# Patient Record
Sex: Female | Born: 1935 | Race: White | Hispanic: No | Smoking: Never smoker
Health system: Southern US, Community
[De-identification: ages and names within clinical notes are randomized; demographics above are authoritative.]

## PROBLEM LIST (undated history)

## (undated) DIAGNOSIS — M545 Low back pain, unspecified: Secondary | ICD-10-CM

## (undated) DIAGNOSIS — M810 Age-related osteoporosis without current pathological fracture: Secondary | ICD-10-CM

## (undated) DIAGNOSIS — I10 Essential (primary) hypertension: Secondary | ICD-10-CM

## (undated) DIAGNOSIS — E782 Mixed hyperlipidemia: Secondary | ICD-10-CM

## (undated) DIAGNOSIS — I872 Venous insufficiency (chronic) (peripheral): Secondary | ICD-10-CM

## (undated) HISTORY — DX: Age-related osteoporosis without current pathological fracture: M81.0

## (undated) HISTORY — DX: Low back pain, unspecified: M54.50

## (undated) HISTORY — DX: Hypercalcemia: E83.52

## (undated) HISTORY — DX: Essential (primary) hypertension: I10

## (undated) HISTORY — PX: COLONOSCOPY: SHX174

## (undated) HISTORY — DX: Mixed hyperlipidemia: E78.2

## (undated) HISTORY — PX: GALLBLADDER SURGERY: SHX652

---

## 2009-05-20 ENCOUNTER — Ambulatory Visit: Payer: Self-pay | Admitting: Cardiology

## 2011-07-31 DIAGNOSIS — I10 Essential (primary) hypertension: Secondary | ICD-10-CM | POA: Diagnosis not present

## 2011-07-31 DIAGNOSIS — M159 Polyosteoarthritis, unspecified: Secondary | ICD-10-CM | POA: Diagnosis not present

## 2011-07-31 DIAGNOSIS — E782 Mixed hyperlipidemia: Secondary | ICD-10-CM | POA: Diagnosis not present

## 2011-07-31 DIAGNOSIS — M549 Dorsalgia, unspecified: Secondary | ICD-10-CM | POA: Diagnosis not present

## 2011-07-31 DIAGNOSIS — K219 Gastro-esophageal reflux disease without esophagitis: Secondary | ICD-10-CM | POA: Diagnosis not present

## 2011-07-31 DIAGNOSIS — R5382 Chronic fatigue, unspecified: Secondary | ICD-10-CM | POA: Diagnosis not present

## 2011-08-10 DIAGNOSIS — M79609 Pain in unspecified limb: Secondary | ICD-10-CM | POA: Diagnosis not present

## 2011-08-10 DIAGNOSIS — M659 Synovitis and tenosynovitis, unspecified: Secondary | ICD-10-CM | POA: Diagnosis not present

## 2011-08-10 DIAGNOSIS — M779 Enthesopathy, unspecified: Secondary | ICD-10-CM | POA: Diagnosis not present

## 2011-08-27 DIAGNOSIS — R109 Unspecified abdominal pain: Secondary | ICD-10-CM | POA: Diagnosis not present

## 2011-08-31 DIAGNOSIS — M659 Synovitis and tenosynovitis, unspecified: Secondary | ICD-10-CM | POA: Diagnosis not present

## 2011-08-31 DIAGNOSIS — M79609 Pain in unspecified limb: Secondary | ICD-10-CM | POA: Diagnosis not present

## 2011-09-02 DIAGNOSIS — K5730 Diverticulosis of large intestine without perforation or abscess without bleeding: Secondary | ICD-10-CM | POA: Diagnosis not present

## 2011-09-02 DIAGNOSIS — K5289 Other specified noninfective gastroenteritis and colitis: Secondary | ICD-10-CM | POA: Diagnosis not present

## 2011-09-02 DIAGNOSIS — R109 Unspecified abdominal pain: Secondary | ICD-10-CM | POA: Diagnosis not present

## 2011-09-02 DIAGNOSIS — M1990 Unspecified osteoarthritis, unspecified site: Secondary | ICD-10-CM | POA: Diagnosis not present

## 2011-09-02 DIAGNOSIS — K219 Gastro-esophageal reflux disease without esophagitis: Secondary | ICD-10-CM | POA: Diagnosis not present

## 2011-09-02 DIAGNOSIS — I10 Essential (primary) hypertension: Secondary | ICD-10-CM | POA: Diagnosis not present

## 2011-09-02 DIAGNOSIS — K319 Disease of stomach and duodenum, unspecified: Secondary | ICD-10-CM | POA: Diagnosis not present

## 2011-09-02 DIAGNOSIS — G252 Other specified forms of tremor: Secondary | ICD-10-CM | POA: Diagnosis not present

## 2011-09-02 DIAGNOSIS — R1013 Epigastric pain: Secondary | ICD-10-CM | POA: Diagnosis not present

## 2011-09-02 DIAGNOSIS — K644 Residual hemorrhoidal skin tags: Secondary | ICD-10-CM | POA: Diagnosis not present

## 2011-09-02 DIAGNOSIS — Z79899 Other long term (current) drug therapy: Secondary | ICD-10-CM | POA: Diagnosis not present

## 2011-09-02 DIAGNOSIS — D126 Benign neoplasm of colon, unspecified: Secondary | ICD-10-CM | POA: Diagnosis not present

## 2011-09-17 DIAGNOSIS — K319 Disease of stomach and duodenum, unspecified: Secondary | ICD-10-CM | POA: Diagnosis not present

## 2011-10-09 DIAGNOSIS — Z79899 Other long term (current) drug therapy: Secondary | ICD-10-CM | POA: Diagnosis not present

## 2011-10-09 DIAGNOSIS — K259 Gastric ulcer, unspecified as acute or chronic, without hemorrhage or perforation: Secondary | ICD-10-CM | POA: Diagnosis not present

## 2011-10-09 DIAGNOSIS — F411 Generalized anxiety disorder: Secondary | ICD-10-CM | POA: Diagnosis not present

## 2011-10-09 DIAGNOSIS — N200 Calculus of kidney: Secondary | ICD-10-CM | POA: Diagnosis not present

## 2011-10-09 DIAGNOSIS — K319 Disease of stomach and duodenum, unspecified: Secondary | ICD-10-CM | POA: Diagnosis not present

## 2011-10-09 DIAGNOSIS — I10 Essential (primary) hypertension: Secondary | ICD-10-CM | POA: Diagnosis not present

## 2011-10-09 DIAGNOSIS — D371 Neoplasm of uncertain behavior of stomach: Secondary | ICD-10-CM | POA: Diagnosis not present

## 2011-10-09 DIAGNOSIS — D379 Neoplasm of uncertain behavior of digestive organ, unspecified: Secondary | ICD-10-CM | POA: Diagnosis not present

## 2011-11-06 DIAGNOSIS — M76899 Other specified enthesopathies of unspecified lower limb, excluding foot: Secondary | ICD-10-CM | POA: Diagnosis not present

## 2011-11-06 DIAGNOSIS — M25569 Pain in unspecified knee: Secondary | ICD-10-CM | POA: Diagnosis not present

## 2012-02-01 DIAGNOSIS — H43399 Other vitreous opacities, unspecified eye: Secondary | ICD-10-CM | POA: Diagnosis not present

## 2012-02-01 DIAGNOSIS — H43819 Vitreous degeneration, unspecified eye: Secondary | ICD-10-CM | POA: Diagnosis not present

## 2012-02-11 DIAGNOSIS — E782 Mixed hyperlipidemia: Secondary | ICD-10-CM | POA: Diagnosis not present

## 2012-02-11 DIAGNOSIS — M1710 Unilateral primary osteoarthritis, unspecified knee: Secondary | ICD-10-CM | POA: Diagnosis not present

## 2012-03-29 DIAGNOSIS — Z23 Encounter for immunization: Secondary | ICD-10-CM | POA: Diagnosis not present

## 2012-05-06 DIAGNOSIS — M549 Dorsalgia, unspecified: Secondary | ICD-10-CM | POA: Diagnosis not present

## 2012-05-23 DIAGNOSIS — M1710 Unilateral primary osteoarthritis, unspecified knee: Secondary | ICD-10-CM | POA: Diagnosis not present

## 2012-05-30 DIAGNOSIS — Z1231 Encounter for screening mammogram for malignant neoplasm of breast: Secondary | ICD-10-CM | POA: Diagnosis not present

## 2012-06-06 DIAGNOSIS — M1710 Unilateral primary osteoarthritis, unspecified knee: Secondary | ICD-10-CM | POA: Diagnosis not present

## 2012-06-13 DIAGNOSIS — M1710 Unilateral primary osteoarthritis, unspecified knee: Secondary | ICD-10-CM | POA: Diagnosis not present

## 2012-06-20 DIAGNOSIS — M1710 Unilateral primary osteoarthritis, unspecified knee: Secondary | ICD-10-CM | POA: Diagnosis not present

## 2012-08-05 DIAGNOSIS — K2990 Gastroduodenitis, unspecified, without bleeding: Secondary | ICD-10-CM | POA: Diagnosis not present

## 2012-08-05 DIAGNOSIS — I10 Essential (primary) hypertension: Secondary | ICD-10-CM | POA: Diagnosis not present

## 2012-08-25 DIAGNOSIS — R109 Unspecified abdominal pain: Secondary | ICD-10-CM | POA: Diagnosis not present

## 2012-09-08 DIAGNOSIS — M549 Dorsalgia, unspecified: Secondary | ICD-10-CM | POA: Diagnosis not present

## 2012-09-12 DIAGNOSIS — R109 Unspecified abdominal pain: Secondary | ICD-10-CM | POA: Diagnosis not present

## 2012-09-15 DIAGNOSIS — M4120 Other idiopathic scoliosis, site unspecified: Secondary | ICD-10-CM | POA: Diagnosis not present

## 2012-09-15 DIAGNOSIS — M48061 Spinal stenosis, lumbar region without neurogenic claudication: Secondary | ICD-10-CM | POA: Diagnosis not present

## 2012-09-15 DIAGNOSIS — M5137 Other intervertebral disc degeneration, lumbosacral region: Secondary | ICD-10-CM | POA: Diagnosis not present

## 2012-09-15 DIAGNOSIS — M47817 Spondylosis without myelopathy or radiculopathy, lumbosacral region: Secondary | ICD-10-CM | POA: Diagnosis not present

## 2012-09-15 DIAGNOSIS — M5126 Other intervertebral disc displacement, lumbar region: Secondary | ICD-10-CM | POA: Diagnosis not present

## 2012-10-31 DIAGNOSIS — IMO0002 Reserved for concepts with insufficient information to code with codable children: Secondary | ICD-10-CM | POA: Diagnosis not present

## 2012-10-31 DIAGNOSIS — M1710 Unilateral primary osteoarthritis, unspecified knee: Secondary | ICD-10-CM | POA: Diagnosis not present

## 2012-11-07 DIAGNOSIS — E782 Mixed hyperlipidemia: Secondary | ICD-10-CM | POA: Diagnosis not present

## 2012-11-07 DIAGNOSIS — I10 Essential (primary) hypertension: Secondary | ICD-10-CM | POA: Diagnosis not present

## 2012-11-07 DIAGNOSIS — M76899 Other specified enthesopathies of unspecified lower limb, excluding foot: Secondary | ICD-10-CM | POA: Diagnosis not present

## 2012-11-07 DIAGNOSIS — Z0000 Encounter for general adult medical examination without abnormal findings: Secondary | ICD-10-CM | POA: Diagnosis not present

## 2012-11-25 DIAGNOSIS — J209 Acute bronchitis, unspecified: Secondary | ICD-10-CM | POA: Diagnosis not present

## 2012-11-25 DIAGNOSIS — Z79899 Other long term (current) drug therapy: Secondary | ICD-10-CM | POA: Diagnosis not present

## 2012-11-25 DIAGNOSIS — J449 Chronic obstructive pulmonary disease, unspecified: Secondary | ICD-10-CM | POA: Diagnosis not present

## 2012-11-25 DIAGNOSIS — I10 Essential (primary) hypertension: Secondary | ICD-10-CM | POA: Diagnosis not present

## 2012-12-01 DIAGNOSIS — J209 Acute bronchitis, unspecified: Secondary | ICD-10-CM | POA: Diagnosis not present

## 2012-12-14 DIAGNOSIS — M1710 Unilateral primary osteoarthritis, unspecified knee: Secondary | ICD-10-CM | POA: Diagnosis not present

## 2012-12-14 DIAGNOSIS — M2340 Loose body in knee, unspecified knee: Secondary | ICD-10-CM | POA: Diagnosis not present

## 2012-12-14 DIAGNOSIS — M23329 Other meniscus derangements, posterior horn of medial meniscus, unspecified knee: Secondary | ICD-10-CM | POA: Diagnosis not present

## 2012-12-14 DIAGNOSIS — M899 Disorder of bone, unspecified: Secondary | ICD-10-CM | POA: Diagnosis not present

## 2012-12-14 DIAGNOSIS — IMO0002 Reserved for concepts with insufficient information to code with codable children: Secondary | ICD-10-CM | POA: Diagnosis not present

## 2012-12-19 DIAGNOSIS — M1710 Unilateral primary osteoarthritis, unspecified knee: Secondary | ICD-10-CM | POA: Diagnosis not present

## 2012-12-19 DIAGNOSIS — IMO0002 Reserved for concepts with insufficient information to code with codable children: Secondary | ICD-10-CM | POA: Diagnosis not present

## 2012-12-20 DIAGNOSIS — J0100 Acute maxillary sinusitis, unspecified: Secondary | ICD-10-CM | POA: Diagnosis not present

## 2013-01-10 DIAGNOSIS — Z888 Allergy status to other drugs, medicaments and biological substances status: Secondary | ICD-10-CM | POA: Diagnosis not present

## 2013-01-10 DIAGNOSIS — M1990 Unspecified osteoarthritis, unspecified site: Secondary | ICD-10-CM | POA: Diagnosis not present

## 2013-01-10 DIAGNOSIS — Z8489 Family history of other specified conditions: Secondary | ICD-10-CM | POA: Diagnosis not present

## 2013-01-10 DIAGNOSIS — Z880 Allergy status to penicillin: Secondary | ICD-10-CM | POA: Diagnosis not present

## 2013-01-10 DIAGNOSIS — M1710 Unilateral primary osteoarthritis, unspecified knee: Secondary | ICD-10-CM | POA: Diagnosis not present

## 2013-01-10 DIAGNOSIS — M129 Arthropathy, unspecified: Secondary | ICD-10-CM | POA: Diagnosis not present

## 2013-01-10 DIAGNOSIS — I10 Essential (primary) hypertension: Secondary | ICD-10-CM | POA: Diagnosis not present

## 2013-01-10 DIAGNOSIS — F411 Generalized anxiety disorder: Secondary | ICD-10-CM | POA: Diagnosis not present

## 2013-01-10 DIAGNOSIS — Z883 Allergy status to other anti-infective agents status: Secondary | ICD-10-CM | POA: Diagnosis not present

## 2013-01-10 DIAGNOSIS — M2340 Loose body in knee, unspecified knee: Secondary | ICD-10-CM | POA: Diagnosis not present

## 2013-01-10 DIAGNOSIS — Z885 Allergy status to narcotic agent status: Secondary | ICD-10-CM | POA: Diagnosis not present

## 2013-01-10 DIAGNOSIS — Z91041 Radiographic dye allergy status: Secondary | ICD-10-CM | POA: Diagnosis not present

## 2013-01-10 DIAGNOSIS — K219 Gastro-esophageal reflux disease without esophagitis: Secondary | ICD-10-CM | POA: Diagnosis not present

## 2013-01-10 DIAGNOSIS — E7800 Pure hypercholesterolemia, unspecified: Secondary | ICD-10-CM | POA: Diagnosis not present

## 2013-01-10 DIAGNOSIS — IMO0002 Reserved for concepts with insufficient information to code with codable children: Secondary | ICD-10-CM | POA: Diagnosis not present

## 2013-01-10 DIAGNOSIS — Z79899 Other long term (current) drug therapy: Secondary | ICD-10-CM | POA: Diagnosis not present

## 2013-02-06 DIAGNOSIS — M549 Dorsalgia, unspecified: Secondary | ICD-10-CM | POA: Diagnosis not present

## 2013-02-14 DIAGNOSIS — M5430 Sciatica, unspecified side: Secondary | ICD-10-CM | POA: Diagnosis not present

## 2013-02-14 DIAGNOSIS — M549 Dorsalgia, unspecified: Secondary | ICD-10-CM | POA: Diagnosis not present

## 2013-02-14 DIAGNOSIS — M545 Low back pain: Secondary | ICD-10-CM | POA: Diagnosis not present

## 2013-02-16 DIAGNOSIS — M549 Dorsalgia, unspecified: Secondary | ICD-10-CM | POA: Diagnosis not present

## 2013-02-16 DIAGNOSIS — M5430 Sciatica, unspecified side: Secondary | ICD-10-CM | POA: Diagnosis not present

## 2013-02-16 DIAGNOSIS — M545 Low back pain: Secondary | ICD-10-CM | POA: Diagnosis not present

## 2013-02-21 DIAGNOSIS — M545 Low back pain: Secondary | ICD-10-CM | POA: Diagnosis not present

## 2013-02-21 DIAGNOSIS — M549 Dorsalgia, unspecified: Secondary | ICD-10-CM | POA: Diagnosis not present

## 2013-02-21 DIAGNOSIS — M5430 Sciatica, unspecified side: Secondary | ICD-10-CM | POA: Diagnosis not present

## 2013-02-23 DIAGNOSIS — M549 Dorsalgia, unspecified: Secondary | ICD-10-CM | POA: Diagnosis not present

## 2013-02-23 DIAGNOSIS — M545 Low back pain: Secondary | ICD-10-CM | POA: Diagnosis not present

## 2013-02-23 DIAGNOSIS — M5430 Sciatica, unspecified side: Secondary | ICD-10-CM | POA: Diagnosis not present

## 2013-02-28 DIAGNOSIS — M5430 Sciatica, unspecified side: Secondary | ICD-10-CM | POA: Diagnosis not present

## 2013-02-28 DIAGNOSIS — M545 Low back pain: Secondary | ICD-10-CM | POA: Diagnosis not present

## 2013-02-28 DIAGNOSIS — M549 Dorsalgia, unspecified: Secondary | ICD-10-CM | POA: Diagnosis not present

## 2013-03-02 DIAGNOSIS — M545 Low back pain: Secondary | ICD-10-CM | POA: Diagnosis not present

## 2013-03-02 DIAGNOSIS — M5430 Sciatica, unspecified side: Secondary | ICD-10-CM | POA: Diagnosis not present

## 2013-03-02 DIAGNOSIS — M549 Dorsalgia, unspecified: Secondary | ICD-10-CM | POA: Diagnosis not present

## 2013-03-09 DIAGNOSIS — M549 Dorsalgia, unspecified: Secondary | ICD-10-CM | POA: Diagnosis not present

## 2013-03-09 DIAGNOSIS — M5430 Sciatica, unspecified side: Secondary | ICD-10-CM | POA: Diagnosis not present

## 2013-03-09 DIAGNOSIS — M545 Low back pain: Secondary | ICD-10-CM | POA: Diagnosis not present

## 2013-03-16 DIAGNOSIS — M545 Low back pain: Secondary | ICD-10-CM | POA: Diagnosis not present

## 2013-03-16 DIAGNOSIS — M5430 Sciatica, unspecified side: Secondary | ICD-10-CM | POA: Diagnosis not present

## 2013-03-16 DIAGNOSIS — M549 Dorsalgia, unspecified: Secondary | ICD-10-CM | POA: Diagnosis not present

## 2013-03-27 DIAGNOSIS — I872 Venous insufficiency (chronic) (peripheral): Secondary | ICD-10-CM | POA: Diagnosis not present

## 2013-03-29 DIAGNOSIS — M7120 Synovial cyst of popliteal space [Baker], unspecified knee: Secondary | ICD-10-CM | POA: Diagnosis not present

## 2013-03-29 DIAGNOSIS — M7989 Other specified soft tissue disorders: Secondary | ICD-10-CM | POA: Diagnosis not present

## 2013-03-29 DIAGNOSIS — M79609 Pain in unspecified limb: Secondary | ICD-10-CM | POA: Diagnosis not present

## 2013-04-11 DIAGNOSIS — Z23 Encounter for immunization: Secondary | ICD-10-CM | POA: Diagnosis not present

## 2013-05-09 DIAGNOSIS — I10 Essential (primary) hypertension: Secondary | ICD-10-CM | POA: Diagnosis not present

## 2013-05-09 DIAGNOSIS — M549 Dorsalgia, unspecified: Secondary | ICD-10-CM | POA: Diagnosis not present

## 2013-05-22 DIAGNOSIS — M5137 Other intervertebral disc degeneration, lumbosacral region: Secondary | ICD-10-CM | POA: Diagnosis not present

## 2013-05-22 DIAGNOSIS — M545 Low back pain: Secondary | ICD-10-CM | POA: Diagnosis not present

## 2013-05-22 DIAGNOSIS — E663 Overweight: Secondary | ICD-10-CM | POA: Diagnosis not present

## 2013-06-01 DIAGNOSIS — Z1231 Encounter for screening mammogram for malignant neoplasm of breast: Secondary | ICD-10-CM | POA: Diagnosis not present

## 2013-08-15 DIAGNOSIS — G5600 Carpal tunnel syndrome, unspecified upper limb: Secondary | ICD-10-CM | POA: Diagnosis not present

## 2013-09-18 DIAGNOSIS — H353190 Nonexudative age-related macular degeneration, unspecified eye, stage unspecified: Secondary | ICD-10-CM | POA: Diagnosis not present

## 2013-09-18 DIAGNOSIS — H16229 Keratoconjunctivitis sicca, not specified as Sjogren's, unspecified eye: Secondary | ICD-10-CM | POA: Diagnosis not present

## 2013-10-11 DIAGNOSIS — F410 Panic disorder [episodic paroxysmal anxiety] without agoraphobia: Secondary | ICD-10-CM | POA: Diagnosis not present

## 2013-10-11 DIAGNOSIS — Z9889 Other specified postprocedural states: Secondary | ICD-10-CM | POA: Diagnosis not present

## 2013-10-11 DIAGNOSIS — Z79899 Other long term (current) drug therapy: Secondary | ICD-10-CM | POA: Diagnosis not present

## 2013-10-11 DIAGNOSIS — Z791 Long term (current) use of non-steroidal anti-inflammatories (NSAID): Secondary | ICD-10-CM | POA: Diagnosis not present

## 2013-10-11 DIAGNOSIS — K449 Diaphragmatic hernia without obstruction or gangrene: Secondary | ICD-10-CM | POA: Diagnosis not present

## 2013-10-11 DIAGNOSIS — Z91041 Radiographic dye allergy status: Secondary | ICD-10-CM | POA: Diagnosis not present

## 2013-10-11 DIAGNOSIS — D214 Benign neoplasm of connective and other soft tissue of abdomen: Secondary | ICD-10-CM | POA: Diagnosis not present

## 2013-10-11 DIAGNOSIS — M1990 Unspecified osteoarthritis, unspecified site: Secondary | ICD-10-CM | POA: Diagnosis not present

## 2013-10-11 DIAGNOSIS — Z880 Allergy status to penicillin: Secondary | ICD-10-CM | POA: Diagnosis not present

## 2013-10-11 DIAGNOSIS — Z885 Allergy status to narcotic agent status: Secondary | ICD-10-CM | POA: Diagnosis not present

## 2013-10-11 DIAGNOSIS — Z91010 Allergy to peanuts: Secondary | ICD-10-CM | POA: Diagnosis not present

## 2013-10-11 DIAGNOSIS — I10 Essential (primary) hypertension: Secondary | ICD-10-CM | POA: Diagnosis not present

## 2013-10-11 DIAGNOSIS — N200 Calculus of kidney: Secondary | ICD-10-CM | POA: Diagnosis not present

## 2013-10-11 DIAGNOSIS — K319 Disease of stomach and duodenum, unspecified: Secondary | ICD-10-CM | POA: Diagnosis not present

## 2013-10-11 DIAGNOSIS — Z888 Allergy status to other drugs, medicaments and biological substances status: Secondary | ICD-10-CM | POA: Diagnosis not present

## 2013-10-15 DIAGNOSIS — Z79899 Other long term (current) drug therapy: Secondary | ICD-10-CM | POA: Diagnosis not present

## 2013-10-15 DIAGNOSIS — M545 Low back pain, unspecified: Secondary | ICD-10-CM | POA: Diagnosis not present

## 2013-10-15 DIAGNOSIS — I10 Essential (primary) hypertension: Secondary | ICD-10-CM | POA: Diagnosis not present

## 2013-11-07 DIAGNOSIS — Z131 Encounter for screening for diabetes mellitus: Secondary | ICD-10-CM | POA: Diagnosis not present

## 2013-11-07 DIAGNOSIS — I10 Essential (primary) hypertension: Secondary | ICD-10-CM | POA: Diagnosis not present

## 2013-11-21 DIAGNOSIS — M549 Dorsalgia, unspecified: Secondary | ICD-10-CM | POA: Diagnosis not present

## 2014-01-30 DIAGNOSIS — I10 Essential (primary) hypertension: Secondary | ICD-10-CM | POA: Diagnosis not present

## 2014-01-30 DIAGNOSIS — Z0000 Encounter for general adult medical examination without abnormal findings: Secondary | ICD-10-CM | POA: Diagnosis not present

## 2014-01-30 DIAGNOSIS — N390 Urinary tract infection, site not specified: Secondary | ICD-10-CM | POA: Diagnosis not present

## 2014-03-06 DIAGNOSIS — M25559 Pain in unspecified hip: Secondary | ICD-10-CM | POA: Diagnosis not present

## 2014-03-06 DIAGNOSIS — M47817 Spondylosis without myelopathy or radiculopathy, lumbosacral region: Secondary | ICD-10-CM | POA: Diagnosis not present

## 2014-03-15 DIAGNOSIS — M810 Age-related osteoporosis without current pathological fracture: Secondary | ICD-10-CM | POA: Diagnosis not present

## 2014-03-15 DIAGNOSIS — M949 Disorder of cartilage, unspecified: Secondary | ICD-10-CM | POA: Diagnosis not present

## 2014-03-15 DIAGNOSIS — M899 Disorder of bone, unspecified: Secondary | ICD-10-CM | POA: Diagnosis not present

## 2014-03-15 DIAGNOSIS — Z780 Asymptomatic menopausal state: Secondary | ICD-10-CM | POA: Diagnosis not present

## 2014-03-19 DIAGNOSIS — M25579 Pain in unspecified ankle and joints of unspecified foot: Secondary | ICD-10-CM | POA: Diagnosis not present

## 2014-03-19 DIAGNOSIS — M19079 Primary osteoarthritis, unspecified ankle and foot: Secondary | ICD-10-CM | POA: Diagnosis not present

## 2014-03-19 DIAGNOSIS — M79609 Pain in unspecified limb: Secondary | ICD-10-CM | POA: Diagnosis not present

## 2014-03-22 DIAGNOSIS — M79609 Pain in unspecified limb: Secondary | ICD-10-CM | POA: Diagnosis not present

## 2014-03-22 DIAGNOSIS — M949 Disorder of cartilage, unspecified: Secondary | ICD-10-CM | POA: Diagnosis not present

## 2014-03-22 DIAGNOSIS — Z780 Asymptomatic menopausal state: Secondary | ICD-10-CM | POA: Diagnosis not present

## 2014-03-22 DIAGNOSIS — M899 Disorder of bone, unspecified: Secondary | ICD-10-CM | POA: Diagnosis not present

## 2014-04-03 DIAGNOSIS — Z23 Encounter for immunization: Secondary | ICD-10-CM | POA: Diagnosis not present

## 2014-04-25 DIAGNOSIS — Z23 Encounter for immunization: Secondary | ICD-10-CM | POA: Diagnosis not present

## 2014-05-02 DIAGNOSIS — S82301D Unspecified fracture of lower end of right tibia, subsequent encounter for closed fracture with routine healing: Secondary | ICD-10-CM | POA: Diagnosis not present

## 2014-05-03 DIAGNOSIS — I10 Essential (primary) hypertension: Secondary | ICD-10-CM | POA: Diagnosis not present

## 2014-05-03 DIAGNOSIS — M545 Low back pain: Secondary | ICD-10-CM | POA: Diagnosis not present

## 2014-06-04 DIAGNOSIS — Z1231 Encounter for screening mammogram for malignant neoplasm of breast: Secondary | ICD-10-CM | POA: Diagnosis not present

## 2014-06-06 DIAGNOSIS — S82301D Unspecified fracture of lower end of right tibia, subsequent encounter for closed fracture with routine healing: Secondary | ICD-10-CM | POA: Diagnosis not present

## 2014-08-03 DIAGNOSIS — J019 Acute sinusitis, unspecified: Secondary | ICD-10-CM | POA: Diagnosis not present

## 2014-08-03 DIAGNOSIS — M545 Low back pain: Secondary | ICD-10-CM | POA: Diagnosis not present

## 2014-08-03 DIAGNOSIS — I10 Essential (primary) hypertension: Secondary | ICD-10-CM | POA: Diagnosis not present

## 2014-08-08 DIAGNOSIS — Z131 Encounter for screening for diabetes mellitus: Secondary | ICD-10-CM | POA: Diagnosis not present

## 2014-08-08 DIAGNOSIS — I10 Essential (primary) hypertension: Secondary | ICD-10-CM | POA: Diagnosis not present

## 2014-11-02 DIAGNOSIS — M80051A Age-related osteoporosis with current pathological fracture, right femur, initial encounter for fracture: Secondary | ICD-10-CM | POA: Diagnosis not present

## 2014-11-02 DIAGNOSIS — I10 Essential (primary) hypertension: Secondary | ICD-10-CM | POA: Diagnosis not present

## 2014-11-02 DIAGNOSIS — M545 Low back pain: Secondary | ICD-10-CM | POA: Diagnosis not present

## 2015-01-10 DIAGNOSIS — L03211 Cellulitis of face: Secondary | ICD-10-CM | POA: Diagnosis not present

## 2015-02-04 DIAGNOSIS — Z0000 Encounter for general adult medical examination without abnormal findings: Secondary | ICD-10-CM | POA: Diagnosis not present

## 2015-02-04 DIAGNOSIS — I10 Essential (primary) hypertension: Secondary | ICD-10-CM | POA: Diagnosis not present

## 2015-02-04 DIAGNOSIS — Z1389 Encounter for screening for other disorder: Secondary | ICD-10-CM | POA: Diagnosis not present

## 2015-02-06 DIAGNOSIS — Z131 Encounter for screening for diabetes mellitus: Secondary | ICD-10-CM | POA: Diagnosis not present

## 2015-02-06 DIAGNOSIS — I10 Essential (primary) hypertension: Secondary | ICD-10-CM | POA: Diagnosis not present

## 2015-02-06 DIAGNOSIS — R0602 Shortness of breath: Secondary | ICD-10-CM | POA: Diagnosis not present

## 2015-03-26 DIAGNOSIS — H01111 Allergic dermatitis of right upper eyelid: Secondary | ICD-10-CM | POA: Diagnosis not present

## 2015-04-08 DIAGNOSIS — Z23 Encounter for immunization: Secondary | ICD-10-CM | POA: Diagnosis not present

## 2015-05-07 DIAGNOSIS — M818 Other osteoporosis without current pathological fracture: Secondary | ICD-10-CM | POA: Diagnosis not present

## 2015-05-07 DIAGNOSIS — M1711 Unilateral primary osteoarthritis, right knee: Secondary | ICD-10-CM | POA: Diagnosis not present

## 2015-05-07 DIAGNOSIS — I10 Essential (primary) hypertension: Secondary | ICD-10-CM | POA: Diagnosis not present

## 2015-05-13 DIAGNOSIS — M1712 Unilateral primary osteoarthritis, left knee: Secondary | ICD-10-CM | POA: Diagnosis not present

## 2015-05-13 DIAGNOSIS — M179 Osteoarthritis of knee, unspecified: Secondary | ICD-10-CM | POA: Diagnosis not present

## 2015-06-07 DIAGNOSIS — Z1231 Encounter for screening mammogram for malignant neoplasm of breast: Secondary | ICD-10-CM | POA: Diagnosis not present

## 2015-08-06 DIAGNOSIS — R1311 Dysphagia, oral phase: Secondary | ICD-10-CM | POA: Diagnosis not present

## 2015-08-06 DIAGNOSIS — M818 Other osteoporosis without current pathological fracture: Secondary | ICD-10-CM | POA: Diagnosis not present

## 2015-08-06 DIAGNOSIS — I10 Essential (primary) hypertension: Secondary | ICD-10-CM | POA: Diagnosis not present

## 2015-08-15 DIAGNOSIS — R1319 Other dysphagia: Secondary | ICD-10-CM | POA: Diagnosis not present

## 2015-08-20 DIAGNOSIS — Z8489 Family history of other specified conditions: Secondary | ICD-10-CM | POA: Diagnosis not present

## 2015-08-20 DIAGNOSIS — Z8249 Family history of ischemic heart disease and other diseases of the circulatory system: Secondary | ICD-10-CM | POA: Diagnosis not present

## 2015-08-20 DIAGNOSIS — Z91013 Allergy to seafood: Secondary | ICD-10-CM | POA: Diagnosis not present

## 2015-08-20 DIAGNOSIS — Z886 Allergy status to analgesic agent status: Secondary | ICD-10-CM | POA: Diagnosis not present

## 2015-08-20 DIAGNOSIS — I10 Essential (primary) hypertension: Secondary | ICD-10-CM | POA: Diagnosis not present

## 2015-08-20 DIAGNOSIS — K219 Gastro-esophageal reflux disease without esophagitis: Secondary | ICD-10-CM | POA: Diagnosis not present

## 2015-08-20 DIAGNOSIS — R251 Tremor, unspecified: Secondary | ICD-10-CM | POA: Diagnosis not present

## 2015-08-20 DIAGNOSIS — R1319 Other dysphagia: Secondary | ICD-10-CM | POA: Diagnosis not present

## 2015-08-20 DIAGNOSIS — Z91041 Radiographic dye allergy status: Secondary | ICD-10-CM | POA: Diagnosis not present

## 2015-08-20 DIAGNOSIS — Z79899 Other long term (current) drug therapy: Secondary | ICD-10-CM | POA: Diagnosis not present

## 2015-08-20 DIAGNOSIS — R1310 Dysphagia, unspecified: Secondary | ICD-10-CM | POA: Diagnosis not present

## 2015-08-20 DIAGNOSIS — Z888 Allergy status to other drugs, medicaments and biological substances status: Secondary | ICD-10-CM | POA: Diagnosis not present

## 2015-08-20 DIAGNOSIS — K259 Gastric ulcer, unspecified as acute or chronic, without hemorrhage or perforation: Secondary | ICD-10-CM | POA: Diagnosis not present

## 2015-08-20 DIAGNOSIS — Z91018 Allergy to other foods: Secondary | ICD-10-CM | POA: Diagnosis not present

## 2015-08-20 DIAGNOSIS — Z87442 Personal history of urinary calculi: Secondary | ICD-10-CM | POA: Diagnosis not present

## 2015-08-20 DIAGNOSIS — Z91012 Allergy to eggs: Secondary | ICD-10-CM | POA: Diagnosis not present

## 2015-08-20 DIAGNOSIS — Z883 Allergy status to other anti-infective agents status: Secondary | ICD-10-CM | POA: Diagnosis not present

## 2015-08-20 DIAGNOSIS — M1990 Unspecified osteoarthritis, unspecified site: Secondary | ICD-10-CM | POA: Diagnosis not present

## 2015-10-16 DIAGNOSIS — Z961 Presence of intraocular lens: Secondary | ICD-10-CM | POA: Diagnosis not present

## 2015-11-05 DIAGNOSIS — I10 Essential (primary) hypertension: Secondary | ICD-10-CM | POA: Diagnosis not present

## 2015-11-05 DIAGNOSIS — R0609 Other forms of dyspnea: Secondary | ICD-10-CM | POA: Diagnosis not present

## 2015-11-05 DIAGNOSIS — M818 Other osteoporosis without current pathological fracture: Secondary | ICD-10-CM | POA: Diagnosis not present

## 2015-11-15 DIAGNOSIS — Z888 Allergy status to other drugs, medicaments and biological substances status: Secondary | ICD-10-CM | POA: Diagnosis not present

## 2015-11-15 DIAGNOSIS — Z8249 Family history of ischemic heart disease and other diseases of the circulatory system: Secondary | ICD-10-CM | POA: Diagnosis not present

## 2015-11-15 DIAGNOSIS — J310 Chronic rhinitis: Secondary | ICD-10-CM | POA: Diagnosis not present

## 2015-11-15 DIAGNOSIS — Z79899 Other long term (current) drug therapy: Secondary | ICD-10-CM | POA: Diagnosis not present

## 2015-11-15 DIAGNOSIS — I10 Essential (primary) hypertension: Secondary | ICD-10-CM | POA: Diagnosis not present

## 2015-11-15 DIAGNOSIS — R930 Abnormal findings on diagnostic imaging of skull and head, not elsewhere classified: Secondary | ICD-10-CM | POA: Diagnosis not present

## 2015-11-15 DIAGNOSIS — I160 Hypertensive urgency: Secondary | ICD-10-CM | POA: Diagnosis not present

## 2015-11-15 DIAGNOSIS — G2581 Restless legs syndrome: Secondary | ICD-10-CM | POA: Diagnosis not present

## 2015-11-15 DIAGNOSIS — Z886 Allergy status to analgesic agent status: Secondary | ICD-10-CM | POA: Diagnosis not present

## 2015-11-15 DIAGNOSIS — G459 Transient cerebral ischemic attack, unspecified: Secondary | ICD-10-CM | POA: Diagnosis not present

## 2015-11-15 DIAGNOSIS — G458 Other transient cerebral ischemic attacks and related syndromes: Secondary | ICD-10-CM | POA: Diagnosis not present

## 2015-11-15 DIAGNOSIS — Z780 Asymptomatic menopausal state: Secondary | ICD-10-CM | POA: Diagnosis not present

## 2015-11-15 DIAGNOSIS — M810 Age-related osteoporosis without current pathological fracture: Secondary | ICD-10-CM | POA: Diagnosis not present

## 2015-11-15 DIAGNOSIS — L03113 Cellulitis of right upper limb: Secondary | ICD-10-CM | POA: Diagnosis not present

## 2015-11-15 DIAGNOSIS — Z7951 Long term (current) use of inhaled steroids: Secondary | ICD-10-CM | POA: Diagnosis not present

## 2015-11-15 DIAGNOSIS — K219 Gastro-esophageal reflux disease without esophagitis: Secondary | ICD-10-CM | POA: Diagnosis not present

## 2015-11-15 DIAGNOSIS — Z883 Allergy status to other anti-infective agents status: Secondary | ICD-10-CM | POA: Diagnosis not present

## 2015-11-16 DIAGNOSIS — G458 Other transient cerebral ischemic attacks and related syndromes: Secondary | ICD-10-CM | POA: Diagnosis not present

## 2015-11-16 DIAGNOSIS — I160 Hypertensive urgency: Secondary | ICD-10-CM | POA: Diagnosis not present

## 2015-11-16 DIAGNOSIS — L03113 Cellulitis of right upper limb: Secondary | ICD-10-CM | POA: Diagnosis not present

## 2015-11-16 DIAGNOSIS — M810 Age-related osteoporosis without current pathological fracture: Secondary | ICD-10-CM | POA: Diagnosis not present

## 2015-11-16 DIAGNOSIS — J310 Chronic rhinitis: Secondary | ICD-10-CM | POA: Diagnosis not present

## 2015-11-16 DIAGNOSIS — G459 Transient cerebral ischemic attack, unspecified: Secondary | ICD-10-CM | POA: Diagnosis not present

## 2015-11-16 DIAGNOSIS — G2581 Restless legs syndrome: Secondary | ICD-10-CM | POA: Diagnosis not present

## 2016-01-01 DIAGNOSIS — I10 Essential (primary) hypertension: Secondary | ICD-10-CM | POA: Diagnosis not present

## 2016-01-01 DIAGNOSIS — M818 Other osteoporosis without current pathological fracture: Secondary | ICD-10-CM | POA: Diagnosis not present

## 2016-01-20 DIAGNOSIS — I10 Essential (primary) hypertension: Secondary | ICD-10-CM | POA: Diagnosis not present

## 2016-01-20 DIAGNOSIS — M818 Other osteoporosis without current pathological fracture: Secondary | ICD-10-CM | POA: Diagnosis not present

## 2016-02-18 DIAGNOSIS — Z1389 Encounter for screening for other disorder: Secondary | ICD-10-CM | POA: Diagnosis not present

## 2016-02-18 DIAGNOSIS — M4186 Other forms of scoliosis, lumbar region: Secondary | ICD-10-CM | POA: Diagnosis not present

## 2016-02-18 DIAGNOSIS — M47814 Spondylosis without myelopathy or radiculopathy, thoracic region: Secondary | ICD-10-CM | POA: Diagnosis not present

## 2016-02-18 DIAGNOSIS — R0609 Other forms of dyspnea: Secondary | ICD-10-CM | POA: Diagnosis not present

## 2016-02-18 DIAGNOSIS — I10 Essential (primary) hypertension: Secondary | ICD-10-CM | POA: Diagnosis not present

## 2016-02-18 DIAGNOSIS — M9981 Other biomechanical lesions of cervical region: Secondary | ICD-10-CM | POA: Diagnosis not present

## 2016-02-18 DIAGNOSIS — I700 Atherosclerosis of aorta: Secondary | ICD-10-CM | POA: Diagnosis not present

## 2016-02-18 DIAGNOSIS — M439 Deforming dorsopathy, unspecified: Secondary | ICD-10-CM | POA: Diagnosis not present

## 2016-02-18 DIAGNOSIS — M818 Other osteoporosis without current pathological fracture: Secondary | ICD-10-CM | POA: Diagnosis not present

## 2016-02-18 DIAGNOSIS — Z0000 Encounter for general adult medical examination without abnormal findings: Secondary | ICD-10-CM | POA: Diagnosis not present

## 2016-02-18 DIAGNOSIS — M47812 Spondylosis without myelopathy or radiculopathy, cervical region: Secondary | ICD-10-CM | POA: Diagnosis not present

## 2016-02-18 DIAGNOSIS — M47816 Spondylosis without myelopathy or radiculopathy, lumbar region: Secondary | ICD-10-CM | POA: Diagnosis not present

## 2016-02-18 DIAGNOSIS — M5136 Other intervertebral disc degeneration, lumbar region: Secondary | ICD-10-CM | POA: Diagnosis not present

## 2016-02-18 DIAGNOSIS — M50321 Other cervical disc degeneration at C4-C5 level: Secondary | ICD-10-CM | POA: Diagnosis not present

## 2016-02-19 DIAGNOSIS — M818 Other osteoporosis without current pathological fracture: Secondary | ICD-10-CM | POA: Diagnosis not present

## 2016-02-19 DIAGNOSIS — M4186 Other forms of scoliosis, lumbar region: Secondary | ICD-10-CM | POA: Diagnosis not present

## 2016-02-19 DIAGNOSIS — I10 Essential (primary) hypertension: Secondary | ICD-10-CM | POA: Diagnosis not present

## 2016-02-23 DIAGNOSIS — N3001 Acute cystitis with hematuria: Secondary | ICD-10-CM | POA: Diagnosis not present

## 2016-03-20 DIAGNOSIS — M4186 Other forms of scoliosis, lumbar region: Secondary | ICD-10-CM | POA: Diagnosis not present

## 2016-03-20 DIAGNOSIS — M818 Other osteoporosis without current pathological fracture: Secondary | ICD-10-CM | POA: Diagnosis not present

## 2016-03-20 DIAGNOSIS — I10 Essential (primary) hypertension: Secondary | ICD-10-CM | POA: Diagnosis not present

## 2016-04-01 DIAGNOSIS — M85861 Other specified disorders of bone density and structure, right lower leg: Secondary | ICD-10-CM | POA: Diagnosis not present

## 2016-04-01 DIAGNOSIS — K219 Gastro-esophageal reflux disease without esophagitis: Secondary | ICD-10-CM | POA: Diagnosis not present

## 2016-04-01 DIAGNOSIS — Z79899 Other long term (current) drug therapy: Secondary | ICD-10-CM | POA: Diagnosis not present

## 2016-04-01 DIAGNOSIS — M810 Age-related osteoporosis without current pathological fracture: Secondary | ICD-10-CM | POA: Diagnosis not present

## 2016-04-01 DIAGNOSIS — I10 Essential (primary) hypertension: Secondary | ICD-10-CM | POA: Diagnosis not present

## 2016-04-01 DIAGNOSIS — E7800 Pure hypercholesterolemia, unspecified: Secondary | ICD-10-CM | POA: Diagnosis not present

## 2016-04-01 DIAGNOSIS — M85862 Other specified disorders of bone density and structure, left lower leg: Secondary | ICD-10-CM | POA: Diagnosis not present

## 2016-04-01 DIAGNOSIS — Z8262 Family history of osteoporosis: Secondary | ICD-10-CM | POA: Diagnosis not present

## 2016-04-01 DIAGNOSIS — M1990 Unspecified osteoarthritis, unspecified site: Secondary | ICD-10-CM | POA: Diagnosis not present

## 2016-04-01 DIAGNOSIS — Z780 Asymptomatic menopausal state: Secondary | ICD-10-CM | POA: Diagnosis not present

## 2016-04-12 DIAGNOSIS — Z23 Encounter for immunization: Secondary | ICD-10-CM | POA: Diagnosis not present

## 2016-04-20 DIAGNOSIS — N390 Urinary tract infection, site not specified: Secondary | ICD-10-CM | POA: Diagnosis not present

## 2016-05-13 DIAGNOSIS — I10 Essential (primary) hypertension: Secondary | ICD-10-CM | POA: Diagnosis not present

## 2016-05-13 DIAGNOSIS — M818 Other osteoporosis without current pathological fracture: Secondary | ICD-10-CM | POA: Diagnosis not present

## 2016-05-13 DIAGNOSIS — M4186 Other forms of scoliosis, lumbar region: Secondary | ICD-10-CM | POA: Diagnosis not present

## 2016-05-21 DIAGNOSIS — M818 Other osteoporosis without current pathological fracture: Secondary | ICD-10-CM | POA: Diagnosis not present

## 2016-05-21 DIAGNOSIS — I10 Essential (primary) hypertension: Secondary | ICD-10-CM | POA: Diagnosis not present

## 2016-05-21 DIAGNOSIS — M4186 Other forms of scoliosis, lumbar region: Secondary | ICD-10-CM | POA: Diagnosis not present

## 2016-05-21 DIAGNOSIS — Z79899 Other long term (current) drug therapy: Secondary | ICD-10-CM | POA: Diagnosis not present

## 2016-05-21 DIAGNOSIS — R0609 Other forms of dyspnea: Secondary | ICD-10-CM | POA: Diagnosis not present

## 2016-06-09 DIAGNOSIS — Z1231 Encounter for screening mammogram for malignant neoplasm of breast: Secondary | ICD-10-CM | POA: Diagnosis not present

## 2016-06-15 DIAGNOSIS — M818 Other osteoporosis without current pathological fracture: Secondary | ICD-10-CM | POA: Diagnosis not present

## 2016-06-15 DIAGNOSIS — M4186 Other forms of scoliosis, lumbar region: Secondary | ICD-10-CM | POA: Diagnosis not present

## 2016-06-15 DIAGNOSIS — I10 Essential (primary) hypertension: Secondary | ICD-10-CM | POA: Diagnosis not present

## 2016-06-19 DIAGNOSIS — M71322 Other bursal cyst, left elbow: Secondary | ICD-10-CM | POA: Diagnosis not present

## 2016-07-07 DIAGNOSIS — M818 Other osteoporosis without current pathological fracture: Secondary | ICD-10-CM | POA: Diagnosis not present

## 2016-07-07 DIAGNOSIS — M4186 Other forms of scoliosis, lumbar region: Secondary | ICD-10-CM | POA: Diagnosis not present

## 2016-07-07 DIAGNOSIS — I10 Essential (primary) hypertension: Secondary | ICD-10-CM | POA: Diagnosis not present

## 2016-07-31 DIAGNOSIS — I10 Essential (primary) hypertension: Secondary | ICD-10-CM | POA: Diagnosis not present

## 2016-07-31 DIAGNOSIS — M4186 Other forms of scoliosis, lumbar region: Secondary | ICD-10-CM | POA: Diagnosis not present

## 2016-07-31 DIAGNOSIS — M818 Other osteoporosis without current pathological fracture: Secondary | ICD-10-CM | POA: Diagnosis not present

## 2016-08-24 DIAGNOSIS — I10 Essential (primary) hypertension: Secondary | ICD-10-CM | POA: Diagnosis not present

## 2016-08-24 DIAGNOSIS — M545 Low back pain: Secondary | ICD-10-CM | POA: Diagnosis not present

## 2016-08-24 DIAGNOSIS — M71322 Other bursal cyst, left elbow: Secondary | ICD-10-CM | POA: Diagnosis not present

## 2016-09-01 DIAGNOSIS — I10 Essential (primary) hypertension: Secondary | ICD-10-CM | POA: Diagnosis not present

## 2016-09-01 DIAGNOSIS — M818 Other osteoporosis without current pathological fracture: Secondary | ICD-10-CM | POA: Diagnosis not present

## 2016-09-01 DIAGNOSIS — M4186 Other forms of scoliosis, lumbar region: Secondary | ICD-10-CM | POA: Diagnosis not present

## 2016-09-10 DIAGNOSIS — M71322 Other bursal cyst, left elbow: Secondary | ICD-10-CM | POA: Diagnosis not present

## 2016-09-11 DIAGNOSIS — M25522 Pain in left elbow: Secondary | ICD-10-CM | POA: Diagnosis not present

## 2016-09-11 DIAGNOSIS — M25422 Effusion, left elbow: Secondary | ICD-10-CM | POA: Diagnosis not present

## 2016-09-11 DIAGNOSIS — M7989 Other specified soft tissue disorders: Secondary | ICD-10-CM | POA: Diagnosis not present

## 2016-09-17 DIAGNOSIS — G5622 Lesion of ulnar nerve, left upper limb: Secondary | ICD-10-CM | POA: Diagnosis not present

## 2016-09-17 DIAGNOSIS — M7022 Olecranon bursitis, left elbow: Secondary | ICD-10-CM | POA: Diagnosis not present

## 2016-09-23 DIAGNOSIS — Z886 Allergy status to analgesic agent status: Secondary | ICD-10-CM | POA: Diagnosis not present

## 2016-09-23 DIAGNOSIS — Z79899 Other long term (current) drug therapy: Secondary | ICD-10-CM | POA: Diagnosis not present

## 2016-09-23 DIAGNOSIS — M810 Age-related osteoporosis without current pathological fracture: Secondary | ICD-10-CM | POA: Diagnosis not present

## 2016-09-23 DIAGNOSIS — M7022 Olecranon bursitis, left elbow: Secondary | ICD-10-CM | POA: Diagnosis not present

## 2016-09-23 DIAGNOSIS — Z880 Allergy status to penicillin: Secondary | ICD-10-CM | POA: Diagnosis not present

## 2016-09-23 DIAGNOSIS — R251 Tremor, unspecified: Secondary | ICD-10-CM | POA: Diagnosis not present

## 2016-09-23 DIAGNOSIS — S46312A Strain of muscle, fascia and tendon of triceps, left arm, initial encounter: Secondary | ICD-10-CM | POA: Diagnosis not present

## 2016-09-23 DIAGNOSIS — Z881 Allergy status to other antibiotic agents status: Secondary | ICD-10-CM | POA: Diagnosis not present

## 2016-09-23 DIAGNOSIS — K219 Gastro-esophageal reflux disease without esophagitis: Secondary | ICD-10-CM | POA: Diagnosis not present

## 2016-09-23 DIAGNOSIS — Z8249 Family history of ischemic heart disease and other diseases of the circulatory system: Secondary | ICD-10-CM | POA: Diagnosis not present

## 2016-09-23 DIAGNOSIS — E7800 Pure hypercholesterolemia, unspecified: Secondary | ICD-10-CM | POA: Diagnosis not present

## 2016-09-23 DIAGNOSIS — I10 Essential (primary) hypertension: Secondary | ICD-10-CM | POA: Diagnosis not present

## 2016-09-23 DIAGNOSIS — Z91041 Radiographic dye allergy status: Secondary | ICD-10-CM | POA: Diagnosis not present

## 2016-09-23 DIAGNOSIS — F419 Anxiety disorder, unspecified: Secondary | ICD-10-CM | POA: Diagnosis not present

## 2016-09-23 DIAGNOSIS — Z888 Allergy status to other drugs, medicaments and biological substances status: Secondary | ICD-10-CM | POA: Diagnosis not present

## 2016-09-25 DIAGNOSIS — M7022 Olecranon bursitis, left elbow: Secondary | ICD-10-CM | POA: Diagnosis not present

## 2016-09-25 DIAGNOSIS — S46312A Strain of muscle, fascia and tendon of triceps, left arm, initial encounter: Secondary | ICD-10-CM | POA: Diagnosis not present

## 2016-09-25 DIAGNOSIS — R251 Tremor, unspecified: Secondary | ICD-10-CM | POA: Diagnosis not present

## 2016-09-25 DIAGNOSIS — M069 Rheumatoid arthritis, unspecified: Secondary | ICD-10-CM | POA: Diagnosis not present

## 2016-09-25 DIAGNOSIS — M810 Age-related osteoporosis without current pathological fracture: Secondary | ICD-10-CM | POA: Diagnosis not present

## 2016-09-25 DIAGNOSIS — F419 Anxiety disorder, unspecified: Secondary | ICD-10-CM | POA: Diagnosis not present

## 2016-09-25 DIAGNOSIS — K219 Gastro-esophageal reflux disease without esophagitis: Secondary | ICD-10-CM | POA: Diagnosis not present

## 2016-09-25 DIAGNOSIS — Z888 Allergy status to other drugs, medicaments and biological substances status: Secondary | ICD-10-CM | POA: Diagnosis not present

## 2016-09-25 DIAGNOSIS — Z91041 Radiographic dye allergy status: Secondary | ICD-10-CM | POA: Diagnosis not present

## 2016-09-25 DIAGNOSIS — I10 Essential (primary) hypertension: Secondary | ICD-10-CM | POA: Diagnosis not present

## 2016-09-25 DIAGNOSIS — Z79899 Other long term (current) drug therapy: Secondary | ICD-10-CM | POA: Diagnosis not present

## 2016-09-25 DIAGNOSIS — E7800 Pure hypercholesterolemia, unspecified: Secondary | ICD-10-CM | POA: Diagnosis not present

## 2016-09-25 DIAGNOSIS — X58XXXA Exposure to other specified factors, initial encounter: Secondary | ICD-10-CM | POA: Diagnosis not present

## 2016-09-29 DIAGNOSIS — M4186 Other forms of scoliosis, lumbar region: Secondary | ICD-10-CM | POA: Diagnosis not present

## 2016-09-29 DIAGNOSIS — M818 Other osteoporosis without current pathological fracture: Secondary | ICD-10-CM | POA: Diagnosis not present

## 2016-09-29 DIAGNOSIS — I10 Essential (primary) hypertension: Secondary | ICD-10-CM | POA: Diagnosis not present

## 2016-11-23 DIAGNOSIS — G588 Other specified mononeuropathies: Secondary | ICD-10-CM | POA: Diagnosis not present

## 2016-11-23 DIAGNOSIS — M818 Other osteoporosis without current pathological fracture: Secondary | ICD-10-CM | POA: Diagnosis not present

## 2016-11-23 DIAGNOSIS — M545 Low back pain: Secondary | ICD-10-CM | POA: Diagnosis not present

## 2016-11-23 DIAGNOSIS — I10 Essential (primary) hypertension: Secondary | ICD-10-CM | POA: Diagnosis not present

## 2016-11-23 DIAGNOSIS — Z131 Encounter for screening for diabetes mellitus: Secondary | ICD-10-CM | POA: Diagnosis not present

## 2016-11-23 DIAGNOSIS — R7303 Prediabetes: Secondary | ICD-10-CM | POA: Diagnosis not present

## 2016-11-23 DIAGNOSIS — M4186 Other forms of scoliosis, lumbar region: Secondary | ICD-10-CM | POA: Diagnosis not present

## 2016-12-02 DIAGNOSIS — M4186 Other forms of scoliosis, lumbar region: Secondary | ICD-10-CM | POA: Diagnosis not present

## 2016-12-02 DIAGNOSIS — M818 Other osteoporosis without current pathological fracture: Secondary | ICD-10-CM | POA: Diagnosis not present

## 2016-12-02 DIAGNOSIS — I10 Essential (primary) hypertension: Secondary | ICD-10-CM | POA: Diagnosis not present

## 2016-12-18 DIAGNOSIS — M4186 Other forms of scoliosis, lumbar region: Secondary | ICD-10-CM | POA: Diagnosis not present

## 2016-12-18 DIAGNOSIS — M818 Other osteoporosis without current pathological fracture: Secondary | ICD-10-CM | POA: Diagnosis not present

## 2016-12-18 DIAGNOSIS — I10 Essential (primary) hypertension: Secondary | ICD-10-CM | POA: Diagnosis not present

## 2017-01-19 DIAGNOSIS — M818 Other osteoporosis without current pathological fracture: Secondary | ICD-10-CM | POA: Diagnosis not present

## 2017-01-19 DIAGNOSIS — M4186 Other forms of scoliosis, lumbar region: Secondary | ICD-10-CM | POA: Diagnosis not present

## 2017-01-19 DIAGNOSIS — I10 Essential (primary) hypertension: Secondary | ICD-10-CM | POA: Diagnosis not present

## 2017-02-26 DIAGNOSIS — M818 Other osteoporosis without current pathological fracture: Secondary | ICD-10-CM | POA: Diagnosis not present

## 2017-02-26 DIAGNOSIS — M4186 Other forms of scoliosis, lumbar region: Secondary | ICD-10-CM | POA: Diagnosis not present

## 2017-02-26 DIAGNOSIS — I10 Essential (primary) hypertension: Secondary | ICD-10-CM | POA: Diagnosis not present

## 2017-03-01 DIAGNOSIS — M25551 Pain in right hip: Secondary | ICD-10-CM | POA: Diagnosis not present

## 2017-03-01 DIAGNOSIS — G588 Other specified mononeuropathies: Secondary | ICD-10-CM | POA: Diagnosis not present

## 2017-03-01 DIAGNOSIS — Z0000 Encounter for general adult medical examination without abnormal findings: Secondary | ICD-10-CM | POA: Diagnosis not present

## 2017-03-01 DIAGNOSIS — Z1389 Encounter for screening for other disorder: Secondary | ICD-10-CM | POA: Diagnosis not present

## 2017-03-01 DIAGNOSIS — R7303 Prediabetes: Secondary | ICD-10-CM | POA: Diagnosis not present

## 2017-03-01 DIAGNOSIS — M4186 Other forms of scoliosis, lumbar region: Secondary | ICD-10-CM | POA: Diagnosis not present

## 2017-03-01 DIAGNOSIS — I10 Essential (primary) hypertension: Secondary | ICD-10-CM | POA: Diagnosis not present

## 2017-03-01 DIAGNOSIS — M818 Other osteoporosis without current pathological fracture: Secondary | ICD-10-CM | POA: Diagnosis not present

## 2017-03-01 DIAGNOSIS — M545 Low back pain: Secondary | ICD-10-CM | POA: Diagnosis not present

## 2017-03-02 DIAGNOSIS — M5136 Other intervertebral disc degeneration, lumbar region: Secondary | ICD-10-CM | POA: Diagnosis not present

## 2017-03-02 DIAGNOSIS — M1611 Unilateral primary osteoarthritis, right hip: Secondary | ICD-10-CM | POA: Diagnosis not present

## 2017-03-02 DIAGNOSIS — M160 Bilateral primary osteoarthritis of hip: Secondary | ICD-10-CM | POA: Diagnosis not present

## 2017-03-25 DIAGNOSIS — M818 Other osteoporosis without current pathological fracture: Secondary | ICD-10-CM | POA: Diagnosis not present

## 2017-03-25 DIAGNOSIS — I10 Essential (primary) hypertension: Secondary | ICD-10-CM | POA: Diagnosis not present

## 2017-03-25 DIAGNOSIS — M4186 Other forms of scoliosis, lumbar region: Secondary | ICD-10-CM | POA: Diagnosis not present

## 2017-04-19 DIAGNOSIS — Z23 Encounter for immunization: Secondary | ICD-10-CM | POA: Diagnosis not present

## 2017-05-18 DIAGNOSIS — I10 Essential (primary) hypertension: Secondary | ICD-10-CM | POA: Diagnosis not present

## 2017-05-18 DIAGNOSIS — M818 Other osteoporosis without current pathological fracture: Secondary | ICD-10-CM | POA: Diagnosis not present

## 2017-05-18 DIAGNOSIS — M4186 Other forms of scoliosis, lumbar region: Secondary | ICD-10-CM | POA: Diagnosis not present

## 2017-06-11 DIAGNOSIS — M545 Low back pain: Secondary | ICD-10-CM | POA: Diagnosis not present

## 2017-06-11 DIAGNOSIS — M25551 Pain in right hip: Secondary | ICD-10-CM | POA: Diagnosis not present

## 2017-06-11 DIAGNOSIS — F320 Major depressive disorder, single episode, mild: Secondary | ICD-10-CM | POA: Diagnosis not present

## 2017-06-11 DIAGNOSIS — M4186 Other forms of scoliosis, lumbar region: Secondary | ICD-10-CM | POA: Diagnosis not present

## 2017-06-11 DIAGNOSIS — M818 Other osteoporosis without current pathological fracture: Secondary | ICD-10-CM | POA: Diagnosis not present

## 2017-06-11 DIAGNOSIS — I10 Essential (primary) hypertension: Secondary | ICD-10-CM | POA: Diagnosis not present

## 2017-06-15 DIAGNOSIS — H353131 Nonexudative age-related macular degeneration, bilateral, early dry stage: Secondary | ICD-10-CM | POA: Diagnosis not present

## 2017-06-15 DIAGNOSIS — E113293 Type 2 diabetes mellitus with mild nonproliferative diabetic retinopathy without macular edema, bilateral: Secondary | ICD-10-CM | POA: Diagnosis not present

## 2017-06-18 DIAGNOSIS — M4186 Other forms of scoliosis, lumbar region: Secondary | ICD-10-CM | POA: Diagnosis not present

## 2017-06-18 DIAGNOSIS — M545 Low back pain: Secondary | ICD-10-CM | POA: Diagnosis not present

## 2017-06-18 DIAGNOSIS — F320 Major depressive disorder, single episode, mild: Secondary | ICD-10-CM | POA: Diagnosis not present

## 2017-06-18 DIAGNOSIS — M818 Other osteoporosis without current pathological fracture: Secondary | ICD-10-CM | POA: Diagnosis not present

## 2017-06-18 DIAGNOSIS — I10 Essential (primary) hypertension: Secondary | ICD-10-CM | POA: Diagnosis not present

## 2017-06-22 DIAGNOSIS — R7303 Prediabetes: Secondary | ICD-10-CM | POA: Diagnosis not present

## 2017-07-02 DIAGNOSIS — Z1231 Encounter for screening mammogram for malignant neoplasm of breast: Secondary | ICD-10-CM | POA: Diagnosis not present

## 2017-07-15 DIAGNOSIS — M818 Other osteoporosis without current pathological fracture: Secondary | ICD-10-CM | POA: Diagnosis not present

## 2017-07-15 DIAGNOSIS — I10 Essential (primary) hypertension: Secondary | ICD-10-CM | POA: Diagnosis not present

## 2017-07-15 DIAGNOSIS — M545 Low back pain: Secondary | ICD-10-CM | POA: Diagnosis not present

## 2017-07-15 DIAGNOSIS — M4186 Other forms of scoliosis, lumbar region: Secondary | ICD-10-CM | POA: Diagnosis not present

## 2017-07-15 DIAGNOSIS — F320 Major depressive disorder, single episode, mild: Secondary | ICD-10-CM | POA: Diagnosis not present

## 2017-09-02 DIAGNOSIS — M545 Low back pain: Secondary | ICD-10-CM | POA: Diagnosis not present

## 2017-09-02 DIAGNOSIS — M4186 Other forms of scoliosis, lumbar region: Secondary | ICD-10-CM | POA: Diagnosis not present

## 2017-09-02 DIAGNOSIS — I10 Essential (primary) hypertension: Secondary | ICD-10-CM | POA: Diagnosis not present

## 2017-09-02 DIAGNOSIS — M818 Other osteoporosis without current pathological fracture: Secondary | ICD-10-CM | POA: Diagnosis not present

## 2017-09-02 DIAGNOSIS — F320 Major depressive disorder, single episode, mild: Secondary | ICD-10-CM | POA: Diagnosis not present

## 2017-09-21 DIAGNOSIS — M4186 Other forms of scoliosis, lumbar region: Secondary | ICD-10-CM | POA: Diagnosis not present

## 2017-09-21 DIAGNOSIS — M818 Other osteoporosis without current pathological fracture: Secondary | ICD-10-CM | POA: Diagnosis not present

## 2017-09-21 DIAGNOSIS — F320 Major depressive disorder, single episode, mild: Secondary | ICD-10-CM | POA: Diagnosis not present

## 2017-09-21 DIAGNOSIS — R7303 Prediabetes: Secondary | ICD-10-CM | POA: Diagnosis not present

## 2017-09-21 DIAGNOSIS — M545 Low back pain: Secondary | ICD-10-CM | POA: Diagnosis not present

## 2017-09-21 DIAGNOSIS — Z131 Encounter for screening for diabetes mellitus: Secondary | ICD-10-CM | POA: Diagnosis not present

## 2017-09-21 DIAGNOSIS — M25551 Pain in right hip: Secondary | ICD-10-CM | POA: Diagnosis not present

## 2017-09-21 DIAGNOSIS — I10 Essential (primary) hypertension: Secondary | ICD-10-CM | POA: Diagnosis not present

## 2017-09-29 DIAGNOSIS — F320 Major depressive disorder, single episode, mild: Secondary | ICD-10-CM | POA: Diagnosis not present

## 2017-09-29 DIAGNOSIS — M545 Low back pain: Secondary | ICD-10-CM | POA: Diagnosis not present

## 2017-09-29 DIAGNOSIS — I10 Essential (primary) hypertension: Secondary | ICD-10-CM | POA: Diagnosis not present

## 2017-09-29 DIAGNOSIS — M818 Other osteoporosis without current pathological fracture: Secondary | ICD-10-CM | POA: Diagnosis not present

## 2017-10-15 DIAGNOSIS — M545 Low back pain: Secondary | ICD-10-CM | POA: Diagnosis not present

## 2017-10-15 DIAGNOSIS — M4186 Other forms of scoliosis, lumbar region: Secondary | ICD-10-CM | POA: Diagnosis not present

## 2017-10-15 DIAGNOSIS — I10 Essential (primary) hypertension: Secondary | ICD-10-CM | POA: Diagnosis not present

## 2017-10-15 DIAGNOSIS — F320 Major depressive disorder, single episode, mild: Secondary | ICD-10-CM | POA: Diagnosis not present

## 2017-10-15 DIAGNOSIS — M818 Other osteoporosis without current pathological fracture: Secondary | ICD-10-CM | POA: Diagnosis not present

## 2017-10-19 DIAGNOSIS — H0011 Chalazion right upper eyelid: Secondary | ICD-10-CM | POA: Diagnosis not present

## 2017-10-26 DIAGNOSIS — H0011 Chalazion right upper eyelid: Secondary | ICD-10-CM | POA: Diagnosis not present

## 2017-10-29 DIAGNOSIS — M545 Low back pain: Secondary | ICD-10-CM | POA: Diagnosis not present

## 2017-10-29 DIAGNOSIS — M818 Other osteoporosis without current pathological fracture: Secondary | ICD-10-CM | POA: Diagnosis not present

## 2017-10-29 DIAGNOSIS — F320 Major depressive disorder, single episode, mild: Secondary | ICD-10-CM | POA: Diagnosis not present

## 2017-10-29 DIAGNOSIS — I10 Essential (primary) hypertension: Secondary | ICD-10-CM | POA: Diagnosis not present

## 2017-12-10 DIAGNOSIS — F320 Major depressive disorder, single episode, mild: Secondary | ICD-10-CM | POA: Diagnosis not present

## 2017-12-10 DIAGNOSIS — M545 Low back pain: Secondary | ICD-10-CM | POA: Diagnosis not present

## 2017-12-10 DIAGNOSIS — I10 Essential (primary) hypertension: Secondary | ICD-10-CM | POA: Diagnosis not present

## 2017-12-10 DIAGNOSIS — M818 Other osteoporosis without current pathological fracture: Secondary | ICD-10-CM | POA: Diagnosis not present

## 2017-12-23 DIAGNOSIS — M4186 Other forms of scoliosis, lumbar region: Secondary | ICD-10-CM | POA: Diagnosis not present

## 2017-12-23 DIAGNOSIS — M545 Low back pain: Secondary | ICD-10-CM | POA: Diagnosis not present

## 2017-12-23 DIAGNOSIS — F320 Major depressive disorder, single episode, mild: Secondary | ICD-10-CM | POA: Diagnosis not present

## 2017-12-23 DIAGNOSIS — I10 Essential (primary) hypertension: Secondary | ICD-10-CM | POA: Diagnosis not present

## 2017-12-23 DIAGNOSIS — M818 Other osteoporosis without current pathological fracture: Secondary | ICD-10-CM | POA: Diagnosis not present

## 2018-01-07 DIAGNOSIS — I10 Essential (primary) hypertension: Secondary | ICD-10-CM | POA: Diagnosis not present

## 2018-01-07 DIAGNOSIS — M545 Low back pain: Secondary | ICD-10-CM | POA: Diagnosis not present

## 2018-01-07 DIAGNOSIS — M818 Other osteoporosis without current pathological fracture: Secondary | ICD-10-CM | POA: Diagnosis not present

## 2018-02-02 DIAGNOSIS — I10 Essential (primary) hypertension: Secondary | ICD-10-CM | POA: Diagnosis not present

## 2018-02-02 DIAGNOSIS — M545 Low back pain: Secondary | ICD-10-CM | POA: Diagnosis not present

## 2018-02-02 DIAGNOSIS — M818 Other osteoporosis without current pathological fracture: Secondary | ICD-10-CM | POA: Diagnosis not present

## 2018-03-03 DIAGNOSIS — I10 Essential (primary) hypertension: Secondary | ICD-10-CM | POA: Diagnosis not present

## 2018-03-03 DIAGNOSIS — M545 Low back pain: Secondary | ICD-10-CM | POA: Diagnosis not present

## 2018-03-03 DIAGNOSIS — M818 Other osteoporosis without current pathological fracture: Secondary | ICD-10-CM | POA: Diagnosis not present

## 2018-03-22 DIAGNOSIS — Z23 Encounter for immunization: Secondary | ICD-10-CM | POA: Diagnosis not present

## 2018-04-01 DIAGNOSIS — I10 Essential (primary) hypertension: Secondary | ICD-10-CM | POA: Diagnosis not present

## 2018-04-01 DIAGNOSIS — M818 Other osteoporosis without current pathological fracture: Secondary | ICD-10-CM | POA: Diagnosis not present

## 2018-04-01 DIAGNOSIS — M545 Low back pain: Secondary | ICD-10-CM | POA: Diagnosis not present

## 2018-04-04 DIAGNOSIS — I10 Essential (primary) hypertension: Secondary | ICD-10-CM | POA: Diagnosis not present

## 2018-04-04 DIAGNOSIS — M545 Low back pain: Secondary | ICD-10-CM | POA: Diagnosis not present

## 2018-04-04 DIAGNOSIS — M4186 Other forms of scoliosis, lumbar region: Secondary | ICD-10-CM | POA: Diagnosis not present

## 2018-04-04 DIAGNOSIS — M818 Other osteoporosis without current pathological fracture: Secondary | ICD-10-CM | POA: Diagnosis not present

## 2018-04-04 DIAGNOSIS — F320 Major depressive disorder, single episode, mild: Secondary | ICD-10-CM | POA: Diagnosis not present

## 2018-04-21 DIAGNOSIS — S32029A Unspecified fracture of second lumbar vertebra, initial encounter for closed fracture: Secondary | ICD-10-CM | POA: Diagnosis not present

## 2018-04-21 DIAGNOSIS — M545 Low back pain: Secondary | ICD-10-CM | POA: Diagnosis not present

## 2018-04-21 DIAGNOSIS — S32009A Unspecified fracture of unspecified lumbar vertebra, initial encounter for closed fracture: Secondary | ICD-10-CM | POA: Diagnosis not present

## 2018-04-21 DIAGNOSIS — S3992XA Unspecified injury of lower back, initial encounter: Secondary | ICD-10-CM | POA: Diagnosis not present

## 2018-04-21 DIAGNOSIS — E7800 Pure hypercholesterolemia, unspecified: Secondary | ICD-10-CM | POA: Diagnosis not present

## 2018-04-21 DIAGNOSIS — Z79899 Other long term (current) drug therapy: Secondary | ICD-10-CM | POA: Diagnosis not present

## 2018-04-21 DIAGNOSIS — I10 Essential (primary) hypertension: Secondary | ICD-10-CM | POA: Diagnosis not present

## 2018-04-21 DIAGNOSIS — M5136 Other intervertebral disc degeneration, lumbar region: Secondary | ICD-10-CM | POA: Diagnosis not present

## 2018-04-21 DIAGNOSIS — S32019A Unspecified fracture of first lumbar vertebra, initial encounter for closed fracture: Secondary | ICD-10-CM | POA: Diagnosis not present

## 2018-04-21 DIAGNOSIS — S3993XA Unspecified injury of pelvis, initial encounter: Secondary | ICD-10-CM | POA: Diagnosis not present

## 2018-04-21 DIAGNOSIS — S32039A Unspecified fracture of third lumbar vertebra, initial encounter for closed fracture: Secondary | ICD-10-CM | POA: Diagnosis not present

## 2018-04-21 DIAGNOSIS — Z87891 Personal history of nicotine dependence: Secondary | ICD-10-CM | POA: Diagnosis not present

## 2018-04-25 DIAGNOSIS — M5430 Sciatica, unspecified side: Secondary | ICD-10-CM | POA: Diagnosis not present

## 2018-04-25 DIAGNOSIS — M545 Low back pain: Secondary | ICD-10-CM | POA: Diagnosis not present

## 2018-05-02 DIAGNOSIS — M818 Other osteoporosis without current pathological fracture: Secondary | ICD-10-CM | POA: Diagnosis not present

## 2018-05-02 DIAGNOSIS — M4186 Other forms of scoliosis, lumbar region: Secondary | ICD-10-CM | POA: Diagnosis not present

## 2018-05-02 DIAGNOSIS — I10 Essential (primary) hypertension: Secondary | ICD-10-CM | POA: Diagnosis not present

## 2018-05-13 DIAGNOSIS — H52203 Unspecified astigmatism, bilateral: Secondary | ICD-10-CM | POA: Diagnosis not present

## 2018-05-13 DIAGNOSIS — H43811 Vitreous degeneration, right eye: Secondary | ICD-10-CM | POA: Diagnosis not present

## 2018-05-13 DIAGNOSIS — H353131 Nonexudative age-related macular degeneration, bilateral, early dry stage: Secondary | ICD-10-CM | POA: Diagnosis not present

## 2018-05-26 DIAGNOSIS — H00014 Hordeolum externum left upper eyelid: Secondary | ICD-10-CM | POA: Diagnosis not present

## 2018-05-30 DIAGNOSIS — I10 Essential (primary) hypertension: Secondary | ICD-10-CM | POA: Diagnosis not present

## 2018-05-30 DIAGNOSIS — M818 Other osteoporosis without current pathological fracture: Secondary | ICD-10-CM | POA: Diagnosis not present

## 2018-05-30 DIAGNOSIS — M4186 Other forms of scoliosis, lumbar region: Secondary | ICD-10-CM | POA: Diagnosis not present

## 2018-06-08 DIAGNOSIS — H00014 Hordeolum externum left upper eyelid: Secondary | ICD-10-CM | POA: Diagnosis not present

## 2018-07-06 DIAGNOSIS — Z0000 Encounter for general adult medical examination without abnormal findings: Secondary | ICD-10-CM | POA: Diagnosis not present

## 2018-07-06 DIAGNOSIS — M5430 Sciatica, unspecified side: Secondary | ICD-10-CM | POA: Diagnosis not present

## 2018-07-06 DIAGNOSIS — M545 Low back pain: Secondary | ICD-10-CM | POA: Diagnosis not present

## 2018-07-06 DIAGNOSIS — Z1389 Encounter for screening for other disorder: Secondary | ICD-10-CM | POA: Diagnosis not present

## 2018-07-06 DIAGNOSIS — I10 Essential (primary) hypertension: Secondary | ICD-10-CM | POA: Diagnosis not present

## 2018-07-06 DIAGNOSIS — M810 Age-related osteoporosis without current pathological fracture: Secondary | ICD-10-CM | POA: Diagnosis not present

## 2018-07-06 DIAGNOSIS — E7849 Other hyperlipidemia: Secondary | ICD-10-CM | POA: Diagnosis not present

## 2018-07-07 DIAGNOSIS — M810 Age-related osteoporosis without current pathological fracture: Secondary | ICD-10-CM | POA: Diagnosis not present

## 2018-07-07 DIAGNOSIS — I10 Essential (primary) hypertension: Secondary | ICD-10-CM | POA: Diagnosis not present

## 2018-07-07 DIAGNOSIS — E7849 Other hyperlipidemia: Secondary | ICD-10-CM | POA: Diagnosis not present

## 2018-07-07 DIAGNOSIS — M545 Low back pain: Secondary | ICD-10-CM | POA: Diagnosis not present

## 2018-08-29 DIAGNOSIS — I10 Essential (primary) hypertension: Secondary | ICD-10-CM | POA: Diagnosis not present

## 2018-08-29 DIAGNOSIS — M545 Low back pain: Secondary | ICD-10-CM | POA: Diagnosis not present

## 2018-08-29 DIAGNOSIS — E7849 Other hyperlipidemia: Secondary | ICD-10-CM | POA: Diagnosis not present

## 2018-08-29 DIAGNOSIS — M810 Age-related osteoporosis without current pathological fracture: Secondary | ICD-10-CM | POA: Diagnosis not present

## 2018-09-27 DIAGNOSIS — I10 Essential (primary) hypertension: Secondary | ICD-10-CM | POA: Diagnosis not present

## 2018-09-27 DIAGNOSIS — M810 Age-related osteoporosis without current pathological fracture: Secondary | ICD-10-CM | POA: Diagnosis not present

## 2018-09-27 DIAGNOSIS — E7849 Other hyperlipidemia: Secondary | ICD-10-CM | POA: Diagnosis not present

## 2018-09-27 DIAGNOSIS — M545 Low back pain: Secondary | ICD-10-CM | POA: Diagnosis not present

## 2018-10-13 DIAGNOSIS — M810 Age-related osteoporosis without current pathological fracture: Secondary | ICD-10-CM | POA: Diagnosis not present

## 2018-10-13 DIAGNOSIS — R5383 Other fatigue: Secondary | ICD-10-CM | POA: Diagnosis not present

## 2018-10-13 DIAGNOSIS — M5430 Sciatica, unspecified side: Secondary | ICD-10-CM | POA: Diagnosis not present

## 2018-10-13 DIAGNOSIS — I10 Essential (primary) hypertension: Secondary | ICD-10-CM | POA: Diagnosis not present

## 2018-10-13 DIAGNOSIS — N390 Urinary tract infection, site not specified: Secondary | ICD-10-CM | POA: Diagnosis not present

## 2018-10-13 DIAGNOSIS — M545 Low back pain: Secondary | ICD-10-CM | POA: Diagnosis not present

## 2018-10-13 DIAGNOSIS — E7849 Other hyperlipidemia: Secondary | ICD-10-CM | POA: Diagnosis not present

## 2018-10-13 DIAGNOSIS — Z79899 Other long term (current) drug therapy: Secondary | ICD-10-CM | POA: Diagnosis not present

## 2018-10-13 DIAGNOSIS — R7303 Prediabetes: Secondary | ICD-10-CM | POA: Diagnosis not present

## 2018-10-13 DIAGNOSIS — Z1389 Encounter for screening for other disorder: Secondary | ICD-10-CM | POA: Diagnosis not present

## 2018-10-13 DIAGNOSIS — Z0000 Encounter for general adult medical examination without abnormal findings: Secondary | ICD-10-CM | POA: Diagnosis not present

## 2018-10-25 DIAGNOSIS — M545 Low back pain: Secondary | ICD-10-CM | POA: Diagnosis not present

## 2018-10-25 DIAGNOSIS — M5430 Sciatica, unspecified side: Secondary | ICD-10-CM | POA: Diagnosis not present

## 2018-10-25 DIAGNOSIS — I10 Essential (primary) hypertension: Secondary | ICD-10-CM | POA: Diagnosis not present

## 2018-10-25 DIAGNOSIS — E7849 Other hyperlipidemia: Secondary | ICD-10-CM | POA: Diagnosis not present

## 2018-10-25 DIAGNOSIS — M810 Age-related osteoporosis without current pathological fracture: Secondary | ICD-10-CM | POA: Diagnosis not present

## 2018-11-28 DIAGNOSIS — I10 Essential (primary) hypertension: Secondary | ICD-10-CM | POA: Diagnosis not present

## 2018-11-28 DIAGNOSIS — M545 Low back pain: Secondary | ICD-10-CM | POA: Diagnosis not present

## 2018-11-28 DIAGNOSIS — E7849 Other hyperlipidemia: Secondary | ICD-10-CM | POA: Diagnosis not present

## 2018-11-28 DIAGNOSIS — M810 Age-related osteoporosis without current pathological fracture: Secondary | ICD-10-CM | POA: Diagnosis not present

## 2018-11-28 DIAGNOSIS — M5430 Sciatica, unspecified side: Secondary | ICD-10-CM | POA: Diagnosis not present

## 2018-12-26 DIAGNOSIS — M810 Age-related osteoporosis without current pathological fracture: Secondary | ICD-10-CM | POA: Diagnosis not present

## 2018-12-26 DIAGNOSIS — E7849 Other hyperlipidemia: Secondary | ICD-10-CM | POA: Diagnosis not present

## 2018-12-26 DIAGNOSIS — M5430 Sciatica, unspecified side: Secondary | ICD-10-CM | POA: Diagnosis not present

## 2018-12-26 DIAGNOSIS — M545 Low back pain: Secondary | ICD-10-CM | POA: Diagnosis not present

## 2018-12-26 DIAGNOSIS — I10 Essential (primary) hypertension: Secondary | ICD-10-CM | POA: Diagnosis not present

## 2019-01-17 DIAGNOSIS — M810 Age-related osteoporosis without current pathological fracture: Secondary | ICD-10-CM | POA: Diagnosis not present

## 2019-01-17 DIAGNOSIS — R5382 Chronic fatigue, unspecified: Secondary | ICD-10-CM | POA: Diagnosis not present

## 2019-01-17 DIAGNOSIS — Z79899 Other long term (current) drug therapy: Secondary | ICD-10-CM | POA: Diagnosis not present

## 2019-01-17 DIAGNOSIS — E7849 Other hyperlipidemia: Secondary | ICD-10-CM | POA: Diagnosis not present

## 2019-01-17 DIAGNOSIS — M545 Low back pain: Secondary | ICD-10-CM | POA: Diagnosis not present

## 2019-01-17 DIAGNOSIS — I10 Essential (primary) hypertension: Secondary | ICD-10-CM | POA: Diagnosis not present

## 2019-01-17 DIAGNOSIS — M5430 Sciatica, unspecified side: Secondary | ICD-10-CM | POA: Diagnosis not present

## 2019-02-01 DIAGNOSIS — M810 Age-related osteoporosis without current pathological fracture: Secondary | ICD-10-CM | POA: Diagnosis not present

## 2019-02-01 DIAGNOSIS — M545 Low back pain: Secondary | ICD-10-CM | POA: Diagnosis not present

## 2019-02-01 DIAGNOSIS — E7849 Other hyperlipidemia: Secondary | ICD-10-CM | POA: Diagnosis not present

## 2019-02-01 DIAGNOSIS — I10 Essential (primary) hypertension: Secondary | ICD-10-CM | POA: Diagnosis not present

## 2019-02-16 ENCOUNTER — Other Ambulatory Visit: Payer: Self-pay

## 2019-03-08 DIAGNOSIS — M545 Low back pain: Secondary | ICD-10-CM | POA: Diagnosis not present

## 2019-03-08 DIAGNOSIS — M810 Age-related osteoporosis without current pathological fracture: Secondary | ICD-10-CM | POA: Diagnosis not present

## 2019-03-08 DIAGNOSIS — I10 Essential (primary) hypertension: Secondary | ICD-10-CM | POA: Diagnosis not present

## 2019-03-08 DIAGNOSIS — E7849 Other hyperlipidemia: Secondary | ICD-10-CM | POA: Diagnosis not present

## 2019-03-10 DIAGNOSIS — Z23 Encounter for immunization: Secondary | ICD-10-CM | POA: Diagnosis not present

## 2019-04-04 DIAGNOSIS — I10 Essential (primary) hypertension: Secondary | ICD-10-CM | POA: Diagnosis not present

## 2019-04-04 DIAGNOSIS — E7849 Other hyperlipidemia: Secondary | ICD-10-CM | POA: Diagnosis not present

## 2019-04-04 DIAGNOSIS — M545 Low back pain: Secondary | ICD-10-CM | POA: Diagnosis not present

## 2019-04-04 DIAGNOSIS — M810 Age-related osteoporosis without current pathological fracture: Secondary | ICD-10-CM | POA: Diagnosis not present

## 2019-04-10 DIAGNOSIS — M810 Age-related osteoporosis without current pathological fracture: Secondary | ICD-10-CM | POA: Diagnosis not present

## 2019-04-10 DIAGNOSIS — M545 Low back pain: Secondary | ICD-10-CM | POA: Diagnosis not present

## 2019-04-10 DIAGNOSIS — I10 Essential (primary) hypertension: Secondary | ICD-10-CM | POA: Diagnosis not present

## 2019-04-10 DIAGNOSIS — E782 Mixed hyperlipidemia: Secondary | ICD-10-CM | POA: Diagnosis not present

## 2019-04-10 DIAGNOSIS — M5430 Sciatica, unspecified side: Secondary | ICD-10-CM | POA: Diagnosis not present

## 2019-05-01 DIAGNOSIS — E782 Mixed hyperlipidemia: Secondary | ICD-10-CM | POA: Diagnosis not present

## 2019-05-01 DIAGNOSIS — M545 Low back pain: Secondary | ICD-10-CM | POA: Diagnosis not present

## 2019-05-01 DIAGNOSIS — I10 Essential (primary) hypertension: Secondary | ICD-10-CM | POA: Diagnosis not present

## 2019-05-01 DIAGNOSIS — M810 Age-related osteoporosis without current pathological fracture: Secondary | ICD-10-CM | POA: Diagnosis not present

## 2019-05-31 DIAGNOSIS — E782 Mixed hyperlipidemia: Secondary | ICD-10-CM | POA: Diagnosis not present

## 2019-05-31 DIAGNOSIS — I10 Essential (primary) hypertension: Secondary | ICD-10-CM | POA: Diagnosis not present

## 2019-05-31 DIAGNOSIS — M545 Low back pain: Secondary | ICD-10-CM | POA: Diagnosis not present

## 2019-05-31 DIAGNOSIS — M810 Age-related osteoporosis without current pathological fracture: Secondary | ICD-10-CM | POA: Diagnosis not present

## 2019-08-09 DIAGNOSIS — E782 Mixed hyperlipidemia: Secondary | ICD-10-CM | POA: Diagnosis not present

## 2019-08-09 DIAGNOSIS — I10 Essential (primary) hypertension: Secondary | ICD-10-CM | POA: Diagnosis not present

## 2019-08-09 DIAGNOSIS — M545 Low back pain: Secondary | ICD-10-CM | POA: Diagnosis not present

## 2019-08-09 DIAGNOSIS — M810 Age-related osteoporosis without current pathological fracture: Secondary | ICD-10-CM | POA: Diagnosis not present

## 2019-09-01 DIAGNOSIS — Z23 Encounter for immunization: Secondary | ICD-10-CM | POA: Diagnosis not present

## 2019-09-27 DIAGNOSIS — M545 Low back pain: Secondary | ICD-10-CM | POA: Diagnosis not present

## 2019-09-27 DIAGNOSIS — E782 Mixed hyperlipidemia: Secondary | ICD-10-CM | POA: Diagnosis not present

## 2019-09-27 DIAGNOSIS — I10 Essential (primary) hypertension: Secondary | ICD-10-CM | POA: Diagnosis not present

## 2019-09-27 DIAGNOSIS — M810 Age-related osteoporosis without current pathological fracture: Secondary | ICD-10-CM | POA: Diagnosis not present

## 2019-09-29 DIAGNOSIS — Z23 Encounter for immunization: Secondary | ICD-10-CM | POA: Diagnosis not present

## 2019-10-17 DIAGNOSIS — M545 Low back pain: Secondary | ICD-10-CM | POA: Diagnosis not present

## 2019-10-17 DIAGNOSIS — E782 Mixed hyperlipidemia: Secondary | ICD-10-CM | POA: Diagnosis not present

## 2019-10-17 DIAGNOSIS — Z79899 Other long term (current) drug therapy: Secondary | ICD-10-CM | POA: Diagnosis not present

## 2019-10-17 DIAGNOSIS — Z0000 Encounter for general adult medical examination without abnormal findings: Secondary | ICD-10-CM | POA: Diagnosis not present

## 2019-10-17 DIAGNOSIS — M810 Age-related osteoporosis without current pathological fracture: Secondary | ICD-10-CM | POA: Diagnosis not present

## 2019-10-17 DIAGNOSIS — M5430 Sciatica, unspecified side: Secondary | ICD-10-CM | POA: Diagnosis not present

## 2019-10-17 DIAGNOSIS — I10 Essential (primary) hypertension: Secondary | ICD-10-CM | POA: Diagnosis not present

## 2019-10-25 DIAGNOSIS — M545 Low back pain: Secondary | ICD-10-CM | POA: Diagnosis not present

## 2019-10-25 DIAGNOSIS — E782 Mixed hyperlipidemia: Secondary | ICD-10-CM | POA: Diagnosis not present

## 2019-10-25 DIAGNOSIS — M810 Age-related osteoporosis without current pathological fracture: Secondary | ICD-10-CM | POA: Diagnosis not present

## 2019-10-25 DIAGNOSIS — I10 Essential (primary) hypertension: Secondary | ICD-10-CM | POA: Diagnosis not present

## 2019-11-01 DIAGNOSIS — B351 Tinea unguium: Secondary | ICD-10-CM | POA: Diagnosis not present

## 2019-11-01 DIAGNOSIS — M79676 Pain in unspecified toe(s): Secondary | ICD-10-CM | POA: Diagnosis not present

## 2019-11-24 DIAGNOSIS — M810 Age-related osteoporosis without current pathological fracture: Secondary | ICD-10-CM | POA: Diagnosis not present

## 2019-11-24 DIAGNOSIS — M545 Low back pain: Secondary | ICD-10-CM | POA: Diagnosis not present

## 2019-11-24 DIAGNOSIS — E782 Mixed hyperlipidemia: Secondary | ICD-10-CM | POA: Diagnosis not present

## 2019-11-24 DIAGNOSIS — I10 Essential (primary) hypertension: Secondary | ICD-10-CM | POA: Diagnosis not present

## 2020-02-05 DIAGNOSIS — E782 Mixed hyperlipidemia: Secondary | ICD-10-CM | POA: Diagnosis not present

## 2020-02-05 DIAGNOSIS — M545 Low back pain: Secondary | ICD-10-CM | POA: Diagnosis not present

## 2020-02-05 DIAGNOSIS — M810 Age-related osteoporosis without current pathological fracture: Secondary | ICD-10-CM | POA: Diagnosis not present

## 2020-02-05 DIAGNOSIS — I10 Essential (primary) hypertension: Secondary | ICD-10-CM | POA: Diagnosis not present

## 2020-02-05 DIAGNOSIS — N3942 Incontinence without sensory awareness: Secondary | ICD-10-CM | POA: Diagnosis not present

## 2020-03-13 DIAGNOSIS — M810 Age-related osteoporosis without current pathological fracture: Secondary | ICD-10-CM | POA: Diagnosis not present

## 2020-03-13 DIAGNOSIS — N3942 Incontinence without sensory awareness: Secondary | ICD-10-CM | POA: Diagnosis not present

## 2020-03-13 DIAGNOSIS — I10 Essential (primary) hypertension: Secondary | ICD-10-CM | POA: Diagnosis not present

## 2020-03-13 DIAGNOSIS — M545 Low back pain: Secondary | ICD-10-CM | POA: Diagnosis not present

## 2020-03-13 DIAGNOSIS — E782 Mixed hyperlipidemia: Secondary | ICD-10-CM | POA: Diagnosis not present

## 2020-03-21 DIAGNOSIS — G9389 Other specified disorders of brain: Secondary | ICD-10-CM | POA: Diagnosis not present

## 2020-03-21 DIAGNOSIS — S0083XA Contusion of other part of head, initial encounter: Secondary | ICD-10-CM | POA: Diagnosis not present

## 2020-03-21 DIAGNOSIS — G4489 Other headache syndrome: Secondary | ICD-10-CM | POA: Diagnosis not present

## 2020-03-21 DIAGNOSIS — I10 Essential (primary) hypertension: Secondary | ICD-10-CM | POA: Diagnosis not present

## 2020-03-21 DIAGNOSIS — S60512A Abrasion of left hand, initial encounter: Secondary | ICD-10-CM | POA: Diagnosis not present

## 2020-03-21 DIAGNOSIS — E042 Nontoxic multinodular goiter: Secondary | ICD-10-CM | POA: Diagnosis not present

## 2020-03-21 DIAGNOSIS — R58 Hemorrhage, not elsewhere classified: Secondary | ICD-10-CM | POA: Diagnosis not present

## 2020-03-21 DIAGNOSIS — W010XXA Fall on same level from slipping, tripping and stumbling without subsequent striking against object, initial encounter: Secondary | ICD-10-CM | POA: Diagnosis not present

## 2020-03-21 DIAGNOSIS — S0990XA Unspecified injury of head, initial encounter: Secondary | ICD-10-CM | POA: Diagnosis not present

## 2020-03-21 DIAGNOSIS — S0031XA Abrasion of nose, initial encounter: Secondary | ICD-10-CM | POA: Diagnosis not present

## 2020-03-21 DIAGNOSIS — R609 Edema, unspecified: Secondary | ICD-10-CM | POA: Diagnosis not present

## 2020-03-21 DIAGNOSIS — K048 Radicular cyst: Secondary | ICD-10-CM | POA: Diagnosis not present

## 2020-03-21 DIAGNOSIS — R52 Pain, unspecified: Secondary | ICD-10-CM | POA: Diagnosis not present

## 2020-03-21 DIAGNOSIS — J984 Other disorders of lung: Secondary | ICD-10-CM | POA: Diagnosis not present

## 2020-03-21 DIAGNOSIS — K090 Developmental odontogenic cysts: Secondary | ICD-10-CM | POA: Diagnosis not present

## 2020-03-21 DIAGNOSIS — Z043 Encounter for examination and observation following other accident: Secondary | ICD-10-CM | POA: Diagnosis not present

## 2020-03-21 DIAGNOSIS — E041 Nontoxic single thyroid nodule: Secondary | ICD-10-CM | POA: Diagnosis not present

## 2020-03-26 DIAGNOSIS — S0083XA Contusion of other part of head, initial encounter: Secondary | ICD-10-CM | POA: Diagnosis not present

## 2020-04-11 DIAGNOSIS — I10 Essential (primary) hypertension: Secondary | ICD-10-CM | POA: Diagnosis not present

## 2020-04-11 DIAGNOSIS — M818 Other osteoporosis without current pathological fracture: Secondary | ICD-10-CM | POA: Diagnosis not present

## 2020-04-11 DIAGNOSIS — M4186 Other forms of scoliosis, lumbar region: Secondary | ICD-10-CM | POA: Diagnosis not present

## 2020-04-17 DIAGNOSIS — H43811 Vitreous degeneration, right eye: Secondary | ICD-10-CM | POA: Diagnosis not present

## 2020-04-17 DIAGNOSIS — H52203 Unspecified astigmatism, bilateral: Secondary | ICD-10-CM | POA: Diagnosis not present

## 2020-04-17 DIAGNOSIS — H353131 Nonexudative age-related macular degeneration, bilateral, early dry stage: Secondary | ICD-10-CM | POA: Diagnosis not present

## 2020-04-18 DIAGNOSIS — M810 Age-related osteoporosis without current pathological fracture: Secondary | ICD-10-CM | POA: Diagnosis not present

## 2020-04-18 DIAGNOSIS — I10 Essential (primary) hypertension: Secondary | ICD-10-CM | POA: Diagnosis not present

## 2020-04-18 DIAGNOSIS — E782 Mixed hyperlipidemia: Secondary | ICD-10-CM | POA: Diagnosis not present

## 2020-04-18 DIAGNOSIS — M545 Low back pain: Secondary | ICD-10-CM | POA: Diagnosis not present

## 2020-04-26 DIAGNOSIS — E782 Mixed hyperlipidemia: Secondary | ICD-10-CM | POA: Diagnosis not present

## 2020-04-30 DIAGNOSIS — Z23 Encounter for immunization: Secondary | ICD-10-CM | POA: Diagnosis not present

## 2020-05-01 DIAGNOSIS — E1151 Type 2 diabetes mellitus with diabetic peripheral angiopathy without gangrene: Secondary | ICD-10-CM | POA: Diagnosis not present

## 2020-05-01 DIAGNOSIS — M810 Age-related osteoporosis without current pathological fracture: Secondary | ICD-10-CM | POA: Diagnosis not present

## 2020-05-01 DIAGNOSIS — E782 Mixed hyperlipidemia: Secondary | ICD-10-CM | POA: Diagnosis not present

## 2020-05-01 DIAGNOSIS — I10 Essential (primary) hypertension: Secondary | ICD-10-CM | POA: Diagnosis not present

## 2020-05-01 DIAGNOSIS — B351 Tinea unguium: Secondary | ICD-10-CM | POA: Diagnosis not present

## 2020-05-01 DIAGNOSIS — M79676 Pain in unspecified toe(s): Secondary | ICD-10-CM | POA: Diagnosis not present

## 2020-05-01 DIAGNOSIS — M545 Low back pain, unspecified: Secondary | ICD-10-CM | POA: Diagnosis not present

## 2020-05-28 DIAGNOSIS — E782 Mixed hyperlipidemia: Secondary | ICD-10-CM | POA: Diagnosis not present

## 2020-05-28 DIAGNOSIS — M810 Age-related osteoporosis without current pathological fracture: Secondary | ICD-10-CM | POA: Diagnosis not present

## 2020-05-28 DIAGNOSIS — I10 Essential (primary) hypertension: Secondary | ICD-10-CM | POA: Diagnosis not present

## 2020-05-28 DIAGNOSIS — M545 Low back pain, unspecified: Secondary | ICD-10-CM | POA: Diagnosis not present

## 2020-05-30 ENCOUNTER — Encounter: Payer: Self-pay | Admitting: "Endocrinology

## 2020-05-30 ENCOUNTER — Other Ambulatory Visit: Payer: Self-pay

## 2020-05-30 ENCOUNTER — Ambulatory Visit (INDEPENDENT_AMBULATORY_CARE_PROVIDER_SITE_OTHER): Payer: Medicare Other | Admitting: "Endocrinology

## 2020-05-30 NOTE — Progress Notes (Signed)
Endocrinology Consult Note       05/30/2020, 1:46 PM  Janice [name] is a 86 y.o.-year-old female, referred by her  Neale Burly, MD  , for evaluation for hypercalcemia.   Past Medical History:  Diagnosis Date  . Age-related osteoporosis without current pathological fracture   . Essential hypertension   . Hypercalcemia   . Low back pain   . Mixed hyperlipidemia     Past Surgical History:  Procedure Laterality Date  . COLONOSCOPY      Social History   Tobacco Use  . Smoking status: Never Smoker  Vaping Use  . Vaping Use: Never used  Substance Use Topics  . Alcohol use: Never  . Drug use: Never    Family History  Problem Relation Age of Onset  . AAA (abdominal aortic aneurysm) Mother   . CAD Father     Outpatient Encounter Medications as of 05/30/2020  Medication Sig  . chlordiazePOXIDE (LIBRIUM) 10 MG capsule Take 10 mg by mouth daily as needed for anxiety.  . Cholecalciferol (VITAMIN D-3) 25 MCG (1000 UT) CAPS Take 1 capsule by mouth daily.  Marland Kitchen CRANBERRY CONCENTRATE PO Take 1 tablet by mouth daily.  Marland Kitchen DICLOFENAC PO Take 1 tablet by mouth 2 (two) times daily.  . DULoxetine (CYMBALTA) 20 MG capsule Take 20 mg by mouth daily as needed.  Marland Kitchen losartan-hydrochlorothiazide (HYZAAR) 100-12.5 MG tablet Take 0.5 tablets by mouth daily.  . Multiple Vitamins-Minerals (OCUVITE PO) Take by mouth.  . pramipexole (MIRAPEX) 0.25 MG tablet Take 1 tablet by mouth every 12 (twelve) hours.  . RABEprazole (ACIPHEX) 20 MG tablet Take 20 mg by mouth daily.  . Red Yeast Rice Extract (RED YEAST RICE PO) Take 1 tablet by mouth daily.  . traMADol (ULTRAM) 50 MG tablet Take 50 mg by mouth 2 (two) times daily.  . vitamin B-12 (CYANOCOBALAMIN) 1000 MCG tablet Take 1,000 mcg by mouth daily.  Marland Kitchen alendronate (FOSAMAX) 70 MG tablet Take 70 mg by mouth once a week.  Marland Kitchen atenolol (TENORMIN) 50 MG tablet Take 50 mg by mouth daily.   . fenofibrate 54 MG tablet Take 1 tablet by mouth daily.  . furosemide (LASIX) 20 MG tablet Take 20 mg by mouth daily.  . pantoprazole (PROTONIX) 40 MG tablet Take 40 mg by mouth daily.   No facility-administered encounter medications on file as of 05/30/2020.    Allergies  Allergen Reactions  . Codeine   . Erythromycin   . Fish Oil   . Imdur [Isosorbide Nitrate]   . Iodine   . Penicillins   . Primidone      HPI  [name] was diagnosed with hypercalcemia in September 2021.  Patient has no previously known history of parathyroid, pituitary, adrenal dysfunctions; no family history of such dysfunctions. -Patient however has medical history of osteoporosis on alendronate treatment for at least 5 years.  She is following up with her PMD for this diagnosis and treatment. -Review of herreferral package of most recent labs reveals calcium of 11.4 on April 18, 2020 with no corresponding PTH. -No available bone density to review.  She has history of wrist  fracture, scoliosis with loss of height.  She is accompanied by her cousin.   No history of  kidney stones.  No history of CKD.  she is not on HCTZ or other thiazide therapy.  Her vitamin D status is not known. she is not on calcium supplements,  she eats dairy and green, leafy, vegetables on average amounts.  she does not have a family history of hypercalcemia, pituitary tumors, thyroid cancer, or osteoporosis.  -She walks with a walker due to disequilibrium.   ROS:  Constitutional: no recent significant weight change,  no fatigue, no subjective hyperthermia, no subjective hypothermia Eyes: no blurry vision, no xerophthalmia ENT: no sore throat, no nodules palpated in throat, no dysphagia/odynophagia, no hoarseness Cardiovascular: no Chest Pain, no Shortness of Breath, no palpitations, no leg swelling Respiratory: no cough, no shortness of breath  Gastrointestinal: no  Nausea/Vomiting/Diarhhea Musculoskeletal: + Walks with a walker due to disequilibrium, no muscle/joint aches Skin: no rashes Neurological: no tremors, no numbness, no tingling, no dizziness Psychiatric: no depression, no anxiety  PE: BP 120/78   Pulse (!) 56   Ht 4\' 8"  (1.422 m)   Wt 112 lb 9.6 oz (51.1 kg)   BMI 25.24 kg/m , Body mass index is 25.24 kg/m. Wt Readings from Last 3 Encounters:  05/30/20 112 lb 9.6 oz (51.1 kg)    Constitutional:  + BMI of 25.2, not in acute distress, normal state of mind Eyes: PERRLA, EOMI, no exophthalmos ENT: moist mucous membranes, no gross thyromegaly, no gross cervical lymphadenopathy Cardiovascular: normal precordial activity, Regular Rate and Rhythm, no Murmur/Rubs/Gallops Respiratory:  adequate breathing efforts, no gross chest deformity, Clear to auscultation bilaterally Gastrointestinal: abdomen soft, Non -tender, No distension, Bowel Sounds present Musculoskeletal: + Significant scoliosis of thoracic spine,  strength intact in all four extremities Skin: moist, warm, no rashes Neurological: no tremor with outstretched hands, Deep tendon reflexes normal in bilateral lower extremities.     April 18, 2020: Labs show calcium of 11.4   Assessment: 1. Hypercalcemia  2.  Osteoporosis  Plan: Patient has had at least 1 instance of elevated calcium at 11.4 with no corresponding PTH.  Etiology of her hypercalcemia is not settled.   -Her vitamin D status is not known. -She has medical history of osteoporosis on treatment.  She denies any history of nephrolithiasis. - I discussed with the patient and her cousin about the physiology of calcium and parathyroid hormone, and possible  effects of  increased PTH/ Calcium , including kidney stones, cardiac dysrhythmias, osteoporosis, abdominal pain, etc.   - The work up so far is not sufficient to reach a conclusion for definitive therapy.  she  needs more studies to confirm and classify the  parathyroid dysfunction she may have. I will proceed to obtain  repeat intact PTH/calcium, serum magnesium, serum phosphorus, PTH related peptide.  This patient is incontinent wearing diapers, difficult to collect urine for 24-hour calcium measurements.   She is not a surgical candidate even if she is diagnosed with primary hyperparathyroidism.  If she is found to have significant hypercalcemia, she will be considered for Sensipar intervention.  -She seems to have established care for osteoporosis management, advised to obtain bone density at least every 2 years.  I suggest her next bone density includes distal 33% of  radius for evaluation of cortical bone, which is predominantly affected by hyperparathyroidism.    She is advised to maintain close follow-up with her PMD Dr. Sherrie Sport.   - Time spent with the patient:  45 minutes, of which >50% was spent in obtaining information about her symptoms, reviewing her previous labs, evaluations, and treatments, counseling her about her hypercalcemia, and developing a plan to confirm the diagnosis and long term treatment as necessary.  Please refer to " Patient Self Inventory" in the Media  tab for reviewed elements of pertinent patient history.  [place] participated in the discussions, expressed understanding, and voiced agreement with the above plans.  All questions were answered to her satisfaction. she is encouraged to contact clinic should she have any questions or concerns prior to her return visit.  - Return in about 10 days (around 06/09/2020) for Labs Today- Non-Fasting Ok.   Glade Lloyd, MD [facility] [address] [place], [place] [address] Phone: [phone]  Fax: [phone]    This note was partially dictated with voice recognition software. Similar sounding words can be transcribed inadequately or may not  be corrected upon review.  05/30/2020, 1:46 PM

## 2020-06-04 LAB — PHOSPHORUS: Phosphorus: 2.3 mg/dL — ABNORMAL LOW (ref 3.0–4.3)

## 2020-06-04 LAB — PTH, INTACT AND CALCIUM
Calcium: 11.3 mg/dL — ABNORMAL HIGH (ref 8.7–10.3)
PTH: 95 pg/mL — ABNORMAL HIGH (ref 15–65)

## 2020-06-04 LAB — PTH-RELATED PEPTIDE: PTH-related peptide: <2 pmol/L

## 2020-06-04 LAB — MAGNESIUM: Magnesium: 1.7 mg/dL (ref 1.6–2.3)

## 2020-06-05 DIAGNOSIS — Z23 Encounter for immunization: Secondary | ICD-10-CM | POA: Diagnosis not present

## 2020-06-11 ENCOUNTER — Telehealth (INDEPENDENT_AMBULATORY_CARE_PROVIDER_SITE_OTHER): Payer: Medicare Other | Admitting: "Endocrinology

## 2020-06-11 ENCOUNTER — Encounter: Payer: Self-pay | Admitting: "Endocrinology

## 2020-06-11 DIAGNOSIS — E210 Primary hyperparathyroidism: Secondary | ICD-10-CM | POA: Insufficient documentation

## 2020-06-11 MED ORDER — CINACALCET HCL 30 MG PO TABS: 30.0000 mg | ORAL_TABLET | Freq: Every day | ORAL | 1 refills | Status: DC

## 2020-06-11 NOTE — Progress Notes (Signed)
Endocrinology follow-up note    06/11/2020, 6:49 PM  Janice [name] is a 86 y.o.-year-old female, being engaged in telehealth via telephone in follow-up after she was seen in consultation for hypercalcemia referred by her  Neale Burly, MD .   Past Medical History:  Diagnosis Date  . Age-related osteoporosis without current pathological fracture   . Essential hypertension   . Hypercalcemia   . Low back pain   . Mixed hyperlipidemia     Past Surgical History:  Procedure Laterality Date  . COLONOSCOPY      Social History   Tobacco Use  . Smoking status: Never Smoker  Vaping Use  . Vaping Use: Never used  Substance Use Topics  . Alcohol use: Never  . Drug use: Never    Family History  Problem Relation Age of Onset  . AAA (abdominal aortic aneurysm) Mother   . CAD Father     Outpatient Encounter Medications as of 06/11/2020  Medication Sig  . alendronate (FOSAMAX) 70 MG tablet Take 70 mg by mouth once a week.  Marland Kitchen atenolol (TENORMIN) 50 MG tablet Take 50 mg by mouth daily.  . chlordiazePOXIDE (LIBRIUM) 10 MG capsule Take 10 mg by mouth daily as needed for anxiety.  . Cholecalciferol (VITAMIN D-3) 25 MCG (1000 UT) CAPS Take 1 capsule by mouth daily.  . cinacalcet (SENSIPAR) 30 MG tablet Take 1 tablet (30 mg total) by mouth daily with breakfast.  . CRANBERRY CONCENTRATE PO Take 1 tablet by mouth daily.  Marland Kitchen DICLOFENAC PO Take 1 tablet by mouth 2 (two) times daily.  . DULoxetine (CYMBALTA) 20 MG capsule Take 20 mg by mouth daily as needed.  . fenofibrate 54 MG tablet Take 1 tablet by mouth daily.  . furosemide (LASIX) 20 MG tablet Take 20 mg by mouth daily.  Marland Kitchen losartan (COZAAR) 100 MG tablet Take 100 mg by mouth daily.  . Multiple Vitamins-Minerals (OCUVITE PO) Take by mouth.  . pantoprazole (PROTONIX) 40 MG tablet Take 40 mg by mouth daily.  . pramipexole (MIRAPEX) 0.25 MG tablet Take 1 tablet  by mouth every 12 (twelve) hours.  . RABEprazole (ACIPHEX) 20 MG tablet Take 20 mg by mouth daily.  . Red Yeast Rice Extract (RED YEAST RICE PO) Take 1 tablet by mouth daily.  . traMADol (ULTRAM) 50 MG tablet Take 50 mg by mouth 2 (two) times daily.  . vitamin B-12 (CYANOCOBALAMIN) 1000 MCG tablet Take 1,000 mcg by mouth daily.  . [DISCONTINUED] losartan-hydrochlorothiazide (HYZAAR) 100-12.5 MG tablet Take 0.5 tablets by mouth daily.   No facility-administered encounter medications on file as of 06/11/2020.    Allergies  Allergen Reactions  . Codeine   . Erythromycin   . Fish Oil   . Imdur [Isosorbide Nitrate]   . Iodine   . Penicillins   . Primidone      HPI  Janice [name] was diagnosed with hypercalcemia in September 2021.  Her previsit labs are consistent with hypercalcemia due to primary hyperparathyroidism.    Patient has no previously known history of parathyroid, pituitary, adrenal dysfunctions; no family history of such dysfunctions. -Patient however has  medical history of osteoporosis on alendronate treatment for at least 5 years.  She is following up with her PMD for this diagnosis and treatment. -Review of herreferral package of most recent labs reveals calcium of 11.4 on April 18, 2020 . Her repeat labs confirm hypercalcemia of 11.3 with associated PTH elevated at 95.  PTH related peptide is not elevated, she has low phosphorus level of 2.3.  -No available bone density to review.  She has history of wrist fracture, scoliosis with loss of height.  She is assisted by her son Janice [name] during this telephone visit.    No history of  kidney stones.  No history of CKD.  she is not on HCTZ or other thiazide therapy.  Her vitamin D status is not known. she is not on calcium supplements,  she eats dairy and green, leafy, vegetables on average amounts.  she does not have a family history of hypercalcemia, pituitary tumors, thyroid cancer, or osteoporosis.  -She  walks with a walker due to disequilibrium.   ROS:  PE: Ht 4\' 8"  (1.422 m)   Wt 112 lb (50.8 kg)   BMI 25.11 kg/m , Body mass index is 25.11 kg/m. Wt Readings from Last 3 Encounters:  06/11/20 112 lb (50.8 kg)  05/30/20 112 lb 9.6 oz (51.1 kg)     April 18, 2020: Labs show calcium of 11.4  Recent Results (from the past 2160 hour(s))  Phosphorus     Status: Abnormal   Collection Time: 05/30/20 11:45 AM  Result Value Ref Range   Phosphorus 2.3 (L) 3.0 - 4.3 mg/dL  Magnesium     Status: None   Collection Time: 05/30/20 11:45 AM  Result Value Ref Range   Magnesium 1.7 1.6 - 2.3 mg/dL  PTH, intact and calcium     Status: Abnormal   Collection Time: 05/30/20 11:45 AM  Result Value Ref Range   Calcium 11.3 (H) 8.7 - 10.3 mg/dL   PTH 95 (H) 15 - 65 pg/mL   PTH Interp Comment     Comment: Interpretation                 Intact PTH    Calcium                                 (pg/mL)      (mg/dL) Normal                          15 - 65     8.6 - 10.2 Primary Hyperparathyroidism         >65          >10.2 Secondary Hyperparathyroidism       >65          <10.2 Non-Parathyroid Hypercalcemia       <65          >10.2 Hypoparathyroidism                  <15          < 8.6 Non-Parathyroid Hypocalcemia    15 - 65          < 8.6   PTH-related peptide     Status: None   Collection Time: 05/30/20 11:45 AM  Result Value Ref Range   PTH-related peptide <2.0 pmol/L    Comment: This test was developed and its performance characteristics determined by  [facility]. It has not been cleared or approved by the Food and Drug Administration. Reference Range: All Ages: <2.0 The PTHrP assay should not be used to exclude cancer or screen tumor patients for humoral hypercalcemia of malignancy (HHM). The results should always be assessed in conjunction with the patient's medical history, clinical examination, and other findings. If test results are clinically discordant, please contact the  laboratory.     Assessment: 1. Hypercalcemia  2.  Osteoporosis  Plan: Patient has had at least 2 instance of elevated calcium at 11.4 , repeat lab confirms hypercalcemia of 11.3 associated with high PTH of 95.  I discussed her labs with her and her son. -She has medical history of osteoporosis on treatment.  She denies any history of nephrolithiasis. -Her work-up is consistent with hypercalcemia due to primary hyperparathyroidism. - I discussed with the patient and her son Janice [name],  about the physiology of calcium and parathyroid hormone, and possible  effects of  increased PTH/ Calcium , including kidney stones, cardiac dysrhythmias, osteoporosis, abdominal pain, etc.   -She will need treatment, however she is not surgical candidate.  -I discussed and prescribed Sensipar 30 mg p.o. daily at breakfast with plan to repeat PTH/calcium in 3-4 months.  -She seems to have established care for osteoporosis management, advised to obtain bone density at least every 2 years.  I suggest her next bone density includes distal 33% of  radius for evaluation of cortical bone, which is predominantly affected by hyperparathyroidism.    She is advised to maintain close follow-up with her PMD Dr. Sherrie Sport.     - Time spent on this patient care encounter:  20 minutes of which 50% was spent in  counseling and the rest reviewing  her current and  previous labs / studies and medications  doses and developing a plan for long term care. [place]  participated in the discussions, expressed understanding, and voiced agreement with the above plans.  All questions were answered to her satisfaction. she is encouraged to contact clinic should she have any questions or concerns prior to her return visit.   - Return in about 3 months (around 09/11/2020) for F/U with Pre-visit Labs, [place] with Janice [name].   Glade Lloyd, MD [facility] [address] [place], [place] [address] Phone: [phone]  Fax: [phone]    This note was partially dictated with voice recognition software. Similar sounding words can be transcribed inadequately or may not  be corrected upon review.  06/11/2020, 6:49 PM

## 2020-06-21 ENCOUNTER — Telehealth: Payer: Self-pay | Admitting: "Endocrinology

## 2020-06-21 ENCOUNTER — Other Ambulatory Visit: Payer: Self-pay

## 2020-06-21 DIAGNOSIS — E210 Primary hyperparathyroidism: Secondary | ICD-10-CM

## 2020-06-21 MED ORDER — CINACALCET HCL 30 MG PO TABS: 30.0000 mg | ORAL_TABLET | Freq: Every day | ORAL | 1 refills | Status: DC

## 2020-06-21 NOTE — Telephone Encounter (Signed)
Pt called and states her pharmacy is telling her they never received   cinacalcet (SENSIPAR) 30 MG tablet .  Can you please resend   [id number] - [place], [place] [facility] [address] Phone:  [phone]  Fax:  [phone]

## 2020-06-21 NOTE — Telephone Encounter (Signed)
Sent in

## 2020-06-24 ENCOUNTER — Telehealth: Payer: Self-pay | Admitting: "Endocrinology

## 2020-06-24 ENCOUNTER — Other Ambulatory Visit: Payer: Self-pay

## 2020-06-24 DIAGNOSIS — E210 Primary hyperparathyroidism: Secondary | ICD-10-CM

## 2020-06-24 MED ORDER — CINACALCET HCL 30 MG PO TABS: 30.0000 mg | ORAL_TABLET | Freq: Every day | ORAL | 1 refills | Status: DC

## 2020-06-24 NOTE — Telephone Encounter (Signed)
I called to do prior authorization for patient,( I spoke with Ashley)she does not meet the criteria for this medication as she has not had a kidney transplant and is not on dialysis. Please advise.

## 2020-06-24 NOTE — Telephone Encounter (Signed)
Pt son left a voicemail stating that [facility] is needing a prior auth for cinacalcet (SENSIPAR) 30 MG tablet And states they can be reached at [phone]

## 2020-06-25 ENCOUNTER — Telehealth: Payer: Self-pay

## 2020-06-25 NOTE — Telephone Encounter (Signed)
[facility] PA dept left a VM that they need a return call regarding some missing information. The PA number is [phone]. You can call back at [phone]

## 2020-06-25 NOTE — Telephone Encounter (Signed)
[facility]ed call to [facility], answered questions with Beverlee Nims at [facility]. Will submit to clinical review.

## 2020-06-25 NOTE — Telephone Encounter (Signed)
[facility] , spoke with [place], she is faxing over form to be filled out for authorization.

## 2020-06-25 NOTE — Telephone Encounter (Signed)
This patient has primary hyperparathyroidism with significant hypercalcemia. She is not a surgical candidate due to advanced age. She is a candidate for Sensipar to control hypercalcemia. That is one of the uses of Sensipar. She does not have to have kidney transplant nor  be on dialysis.

## 2020-06-25 NOTE — Telephone Encounter (Signed)
Submitted prior authorization.

## 2020-06-26 NOTE — Telephone Encounter (Signed)
Called [facility] to check status of prior autho.till Pending review. Can take up to 72 business hours.

## 2020-06-27 NOTE — Telephone Encounter (Signed)
Called and spoke with Clinician Lanelle Bal, authorized medication, called patient to advise of approval.

## 2020-06-28 ENCOUNTER — Telehealth: Payer: Self-pay

## 2020-06-28 NOTE — Telephone Encounter (Signed)
Tried to call pt,also tried to call pt's son but was unable to get an answer and was unable to leave a message.

## 2020-06-29 DIAGNOSIS — M419 Scoliosis, unspecified: Secondary | ICD-10-CM | POA: Diagnosis present

## 2020-06-29 DIAGNOSIS — K810 Acute cholecystitis: Secondary | ICD-10-CM | POA: Diagnosis not present

## 2020-06-29 DIAGNOSIS — K8000 Calculus of gallbladder with acute cholecystitis without obstruction: Secondary | ICD-10-CM | POA: Diagnosis not present

## 2020-06-29 DIAGNOSIS — K859 Acute pancreatitis without necrosis or infection, unspecified: Secondary | ICD-10-CM | POA: Diagnosis not present

## 2020-06-29 DIAGNOSIS — R109 Unspecified abdominal pain: Secondary | ICD-10-CM | POA: Diagnosis not present

## 2020-06-29 DIAGNOSIS — R1110 Vomiting, unspecified: Secondary | ICD-10-CM | POA: Diagnosis not present

## 2020-06-29 DIAGNOSIS — R079 Chest pain, unspecified: Secondary | ICD-10-CM | POA: Diagnosis not present

## 2020-06-29 DIAGNOSIS — Z66 Do not resuscitate: Secondary | ICD-10-CM | POA: Diagnosis present

## 2020-06-29 DIAGNOSIS — K8020 Calculus of gallbladder without cholecystitis without obstruction: Secondary | ICD-10-CM | POA: Diagnosis not present

## 2020-06-29 DIAGNOSIS — Z91041 Radiographic dye allergy status: Secondary | ICD-10-CM | POA: Diagnosis not present

## 2020-06-29 DIAGNOSIS — N281 Cyst of kidney, acquired: Secondary | ICD-10-CM | POA: Diagnosis not present

## 2020-06-29 DIAGNOSIS — E876 Hypokalemia: Secondary | ICD-10-CM | POA: Diagnosis present

## 2020-06-29 DIAGNOSIS — I10 Essential (primary) hypertension: Secondary | ICD-10-CM | POA: Diagnosis present

## 2020-06-29 DIAGNOSIS — Z791 Long term (current) use of non-steroidal anti-inflammatories (NSAID): Secondary | ICD-10-CM | POA: Diagnosis not present

## 2020-06-29 DIAGNOSIS — K851 Biliary acute pancreatitis without necrosis or infection: Secondary | ICD-10-CM | POA: Diagnosis present

## 2020-06-29 DIAGNOSIS — J9811 Atelectasis: Secondary | ICD-10-CM | POA: Diagnosis not present

## 2020-06-29 DIAGNOSIS — E7800 Pure hypercholesterolemia, unspecified: Secondary | ICD-10-CM | POA: Diagnosis present

## 2020-06-29 DIAGNOSIS — R748 Abnormal levels of other serum enzymes: Secondary | ICD-10-CM | POA: Diagnosis not present

## 2020-06-29 DIAGNOSIS — R1010 Upper abdominal pain, unspecified: Secondary | ICD-10-CM | POA: Diagnosis not present

## 2020-06-29 DIAGNOSIS — K219 Gastro-esophageal reflux disease without esophagitis: Secondary | ICD-10-CM | POA: Diagnosis present

## 2020-06-29 DIAGNOSIS — M810 Age-related osteoporosis without current pathological fracture: Secondary | ICD-10-CM | POA: Diagnosis present

## 2020-06-29 DIAGNOSIS — Z90710 Acquired absence of both cervix and uterus: Secondary | ICD-10-CM | POA: Diagnosis not present

## 2020-06-29 DIAGNOSIS — N202 Calculus of kidney with calculus of ureter: Secondary | ICD-10-CM | POA: Diagnosis not present

## 2020-06-29 DIAGNOSIS — K8511 Biliary acute pancreatitis with uninfected necrosis: Secondary | ICD-10-CM | POA: Diagnosis not present

## 2020-06-29 DIAGNOSIS — K8012 Calculus of gallbladder with acute and chronic cholecystitis without obstruction: Secondary | ICD-10-CM | POA: Diagnosis present

## 2020-06-29 DIAGNOSIS — Z7983 Long term (current) use of bisphosphonates: Secondary | ICD-10-CM | POA: Diagnosis not present

## 2020-06-29 DIAGNOSIS — Z880 Allergy status to penicillin: Secondary | ICD-10-CM | POA: Diagnosis not present

## 2020-06-29 DIAGNOSIS — R7989 Other specified abnormal findings of blood chemistry: Secondary | ICD-10-CM | POA: Diagnosis not present

## 2020-06-29 DIAGNOSIS — Z20822 Contact with and (suspected) exposure to covid-19: Secondary | ICD-10-CM | POA: Diagnosis present

## 2020-06-29 DIAGNOSIS — R0602 Shortness of breath: Secondary | ICD-10-CM | POA: Diagnosis not present

## 2020-06-29 DIAGNOSIS — I700 Atherosclerosis of aorta: Secondary | ICD-10-CM | POA: Diagnosis not present

## 2020-06-30 DIAGNOSIS — K8012 Calculus of gallbladder with acute and chronic cholecystitis without obstruction: Secondary | ICD-10-CM | POA: Diagnosis not present

## 2020-06-30 DIAGNOSIS — M810 Age-related osteoporosis without current pathological fracture: Secondary | ICD-10-CM | POA: Diagnosis present

## 2020-06-30 DIAGNOSIS — R7989 Other specified abnormal findings of blood chemistry: Secondary | ICD-10-CM | POA: Diagnosis not present

## 2020-06-30 DIAGNOSIS — Z66 Do not resuscitate: Secondary | ICD-10-CM | POA: Diagnosis present

## 2020-06-30 DIAGNOSIS — Z20822 Contact with and (suspected) exposure to covid-19: Secondary | ICD-10-CM | POA: Diagnosis present

## 2020-06-30 DIAGNOSIS — Z791 Long term (current) use of non-steroidal anti-inflammatories (NSAID): Secondary | ICD-10-CM | POA: Diagnosis not present

## 2020-06-30 DIAGNOSIS — K810 Acute cholecystitis: Secondary | ICD-10-CM | POA: Diagnosis not present

## 2020-06-30 DIAGNOSIS — R109 Unspecified abdominal pain: Secondary | ICD-10-CM | POA: Diagnosis not present

## 2020-06-30 DIAGNOSIS — K8511 Biliary acute pancreatitis with uninfected necrosis: Secondary | ICD-10-CM | POA: Diagnosis not present

## 2020-06-30 DIAGNOSIS — K859 Acute pancreatitis without necrosis or infection, unspecified: Secondary | ICD-10-CM | POA: Diagnosis not present

## 2020-06-30 DIAGNOSIS — N281 Cyst of kidney, acquired: Secondary | ICD-10-CM | POA: Diagnosis not present

## 2020-06-30 DIAGNOSIS — K8000 Calculus of gallbladder with acute cholecystitis without obstruction: Secondary | ICD-10-CM | POA: Diagnosis not present

## 2020-06-30 DIAGNOSIS — E7800 Pure hypercholesterolemia, unspecified: Secondary | ICD-10-CM | POA: Diagnosis present

## 2020-06-30 DIAGNOSIS — Z91041 Radiographic dye allergy status: Secondary | ICD-10-CM | POA: Diagnosis not present

## 2020-06-30 DIAGNOSIS — R1010 Upper abdominal pain, unspecified: Secondary | ICD-10-CM | POA: Diagnosis not present

## 2020-06-30 DIAGNOSIS — K8020 Calculus of gallbladder without cholecystitis without obstruction: Secondary | ICD-10-CM | POA: Diagnosis not present

## 2020-06-30 DIAGNOSIS — K851 Biliary acute pancreatitis without necrosis or infection: Secondary | ICD-10-CM | POA: Diagnosis not present

## 2020-06-30 DIAGNOSIS — K219 Gastro-esophageal reflux disease without esophagitis: Secondary | ICD-10-CM | POA: Diagnosis present

## 2020-06-30 DIAGNOSIS — I10 Essential (primary) hypertension: Secondary | ICD-10-CM | POA: Diagnosis present

## 2020-06-30 DIAGNOSIS — Z880 Allergy status to penicillin: Secondary | ICD-10-CM | POA: Diagnosis not present

## 2020-06-30 DIAGNOSIS — M419 Scoliosis, unspecified: Secondary | ICD-10-CM | POA: Diagnosis not present

## 2020-06-30 DIAGNOSIS — Z7983 Long term (current) use of bisphosphonates: Secondary | ICD-10-CM | POA: Diagnosis not present

## 2020-06-30 DIAGNOSIS — R1110 Vomiting, unspecified: Secondary | ICD-10-CM | POA: Diagnosis not present

## 2020-06-30 DIAGNOSIS — R748 Abnormal levels of other serum enzymes: Secondary | ICD-10-CM | POA: Diagnosis not present

## 2020-06-30 DIAGNOSIS — E876 Hypokalemia: Secondary | ICD-10-CM | POA: Diagnosis not present

## 2020-07-06 DIAGNOSIS — Z48815 Encounter for surgical aftercare following surgery on the digestive system: Secondary | ICD-10-CM | POA: Diagnosis not present

## 2020-07-06 DIAGNOSIS — M1711 Unilateral primary osteoarthritis, right knee: Secondary | ICD-10-CM | POA: Diagnosis not present

## 2020-07-06 DIAGNOSIS — K851 Biliary acute pancreatitis without necrosis or infection: Secondary | ICD-10-CM | POA: Diagnosis not present

## 2020-07-06 DIAGNOSIS — E785 Hyperlipidemia, unspecified: Secondary | ICD-10-CM | POA: Diagnosis not present

## 2020-07-06 DIAGNOSIS — I10 Essential (primary) hypertension: Secondary | ICD-10-CM | POA: Diagnosis not present

## 2020-07-07 DIAGNOSIS — E785 Hyperlipidemia, unspecified: Secondary | ICD-10-CM | POA: Diagnosis not present

## 2020-07-07 DIAGNOSIS — M25461 Effusion, right knee: Secondary | ICD-10-CM | POA: Diagnosis not present

## 2020-07-07 DIAGNOSIS — R2241 Localized swelling, mass and lump, right lower limb: Secondary | ICD-10-CM | POA: Diagnosis not present

## 2020-07-07 DIAGNOSIS — Z9049 Acquired absence of other specified parts of digestive tract: Secondary | ICD-10-CM | POA: Diagnosis not present

## 2020-07-07 DIAGNOSIS — M1712 Unilateral primary osteoarthritis, left knee: Secondary | ICD-10-CM | POA: Diagnosis not present

## 2020-07-07 DIAGNOSIS — I10 Essential (primary) hypertension: Secondary | ICD-10-CM | POA: Diagnosis not present

## 2020-07-07 DIAGNOSIS — M1711 Unilateral primary osteoarthritis, right knee: Secondary | ICD-10-CM | POA: Diagnosis not present

## 2020-07-07 DIAGNOSIS — R600 Localized edema: Secondary | ICD-10-CM | POA: Diagnosis not present

## 2020-07-09 DIAGNOSIS — I10 Essential (primary) hypertension: Secondary | ICD-10-CM | POA: Diagnosis not present

## 2020-07-09 DIAGNOSIS — M1711 Unilateral primary osteoarthritis, right knee: Secondary | ICD-10-CM | POA: Diagnosis not present

## 2020-07-09 DIAGNOSIS — E785 Hyperlipidemia, unspecified: Secondary | ICD-10-CM | POA: Diagnosis not present

## 2020-07-09 DIAGNOSIS — K851 Biliary acute pancreatitis without necrosis or infection: Secondary | ICD-10-CM | POA: Diagnosis not present

## 2020-07-09 DIAGNOSIS — Z48815 Encounter for surgical aftercare following surgery on the digestive system: Secondary | ICD-10-CM | POA: Diagnosis not present

## 2020-07-11 DIAGNOSIS — M1711 Unilateral primary osteoarthritis, right knee: Secondary | ICD-10-CM | POA: Diagnosis not present

## 2020-07-11 DIAGNOSIS — Z48815 Encounter for surgical aftercare following surgery on the digestive system: Secondary | ICD-10-CM | POA: Diagnosis not present

## 2020-07-11 DIAGNOSIS — E785 Hyperlipidemia, unspecified: Secondary | ICD-10-CM | POA: Diagnosis not present

## 2020-07-11 DIAGNOSIS — K851 Biliary acute pancreatitis without necrosis or infection: Secondary | ICD-10-CM | POA: Diagnosis not present

## 2020-07-11 DIAGNOSIS — I10 Essential (primary) hypertension: Secondary | ICD-10-CM | POA: Diagnosis not present

## 2020-07-16 DIAGNOSIS — I10 Essential (primary) hypertension: Secondary | ICD-10-CM | POA: Diagnosis not present

## 2020-07-16 DIAGNOSIS — K851 Biliary acute pancreatitis without necrosis or infection: Secondary | ICD-10-CM | POA: Diagnosis not present

## 2020-07-16 DIAGNOSIS — M1711 Unilateral primary osteoarthritis, right knee: Secondary | ICD-10-CM | POA: Diagnosis not present

## 2020-07-16 DIAGNOSIS — Z48815 Encounter for surgical aftercare following surgery on the digestive system: Secondary | ICD-10-CM | POA: Diagnosis not present

## 2020-07-16 DIAGNOSIS — E785 Hyperlipidemia, unspecified: Secondary | ICD-10-CM | POA: Diagnosis not present

## 2020-07-19 DIAGNOSIS — M810 Age-related osteoporosis without current pathological fracture: Secondary | ICD-10-CM | POA: Diagnosis not present

## 2020-07-19 DIAGNOSIS — I10 Essential (primary) hypertension: Secondary | ICD-10-CM | POA: Diagnosis not present

## 2020-07-19 DIAGNOSIS — I872 Venous insufficiency (chronic) (peripheral): Secondary | ICD-10-CM | POA: Diagnosis not present

## 2020-07-19 DIAGNOSIS — L309 Dermatitis, unspecified: Secondary | ICD-10-CM | POA: Diagnosis not present

## 2020-07-19 DIAGNOSIS — M545 Low back pain, unspecified: Secondary | ICD-10-CM | POA: Diagnosis not present

## 2020-07-19 DIAGNOSIS — E782 Mixed hyperlipidemia: Secondary | ICD-10-CM | POA: Diagnosis not present

## 2020-07-23 DIAGNOSIS — I10 Essential (primary) hypertension: Secondary | ICD-10-CM | POA: Diagnosis not present

## 2020-07-23 DIAGNOSIS — E785 Hyperlipidemia, unspecified: Secondary | ICD-10-CM | POA: Diagnosis not present

## 2020-07-23 DIAGNOSIS — M1711 Unilateral primary osteoarthritis, right knee: Secondary | ICD-10-CM | POA: Diagnosis not present

## 2020-07-23 DIAGNOSIS — Z48815 Encounter for surgical aftercare following surgery on the digestive system: Secondary | ICD-10-CM | POA: Diagnosis not present

## 2020-07-23 DIAGNOSIS — K851 Biliary acute pancreatitis without necrosis or infection: Secondary | ICD-10-CM | POA: Diagnosis not present

## 2020-07-25 DIAGNOSIS — I10 Essential (primary) hypertension: Secondary | ICD-10-CM | POA: Diagnosis not present

## 2020-07-25 DIAGNOSIS — M1711 Unilateral primary osteoarthritis, right knee: Secondary | ICD-10-CM | POA: Diagnosis not present

## 2020-07-25 DIAGNOSIS — K851 Biliary acute pancreatitis without necrosis or infection: Secondary | ICD-10-CM | POA: Diagnosis not present

## 2020-07-25 DIAGNOSIS — E785 Hyperlipidemia, unspecified: Secondary | ICD-10-CM | POA: Diagnosis not present

## 2020-07-25 DIAGNOSIS — Z48815 Encounter for surgical aftercare following surgery on the digestive system: Secondary | ICD-10-CM | POA: Diagnosis not present

## 2020-07-30 DIAGNOSIS — Z48815 Encounter for surgical aftercare following surgery on the digestive system: Secondary | ICD-10-CM | POA: Diagnosis not present

## 2020-07-30 DIAGNOSIS — I10 Essential (primary) hypertension: Secondary | ICD-10-CM | POA: Diagnosis not present

## 2020-07-30 DIAGNOSIS — K851 Biliary acute pancreatitis without necrosis or infection: Secondary | ICD-10-CM | POA: Diagnosis not present

## 2020-07-30 DIAGNOSIS — M1711 Unilateral primary osteoarthritis, right knee: Secondary | ICD-10-CM | POA: Diagnosis not present

## 2020-07-30 DIAGNOSIS — E785 Hyperlipidemia, unspecified: Secondary | ICD-10-CM | POA: Diagnosis not present

## 2020-08-01 DIAGNOSIS — Z48815 Encounter for surgical aftercare following surgery on the digestive system: Secondary | ICD-10-CM | POA: Diagnosis not present

## 2020-08-01 DIAGNOSIS — I10 Essential (primary) hypertension: Secondary | ICD-10-CM | POA: Diagnosis not present

## 2020-08-01 DIAGNOSIS — E785 Hyperlipidemia, unspecified: Secondary | ICD-10-CM | POA: Diagnosis not present

## 2020-08-01 DIAGNOSIS — M1711 Unilateral primary osteoarthritis, right knee: Secondary | ICD-10-CM | POA: Diagnosis not present

## 2020-08-01 DIAGNOSIS — K851 Biliary acute pancreatitis without necrosis or infection: Secondary | ICD-10-CM | POA: Diagnosis not present

## 2020-09-04 DIAGNOSIS — E210 Primary hyperparathyroidism: Secondary | ICD-10-CM | POA: Diagnosis not present

## 2020-09-05 DIAGNOSIS — I10 Essential (primary) hypertension: Secondary | ICD-10-CM | POA: Diagnosis not present

## 2020-09-05 LAB — PTH, INTACT AND CALCIUM
Calcium: 10.7 mg/dL — ABNORMAL HIGH (ref 8.7–10.3)
PTH: 44 pg/mL (ref 15–65)

## 2020-09-11 ENCOUNTER — Other Ambulatory Visit: Payer: Self-pay

## 2020-09-11 ENCOUNTER — Ambulatory Visit (INDEPENDENT_AMBULATORY_CARE_PROVIDER_SITE_OTHER): Payer: Medicare Other | Admitting: "Endocrinology

## 2020-09-11 ENCOUNTER — Encounter: Payer: Self-pay | Admitting: "Endocrinology

## 2020-09-11 DIAGNOSIS — M810 Age-related osteoporosis without current pathological fracture: Secondary | ICD-10-CM

## 2020-09-11 DIAGNOSIS — E210 Primary hyperparathyroidism: Secondary | ICD-10-CM | POA: Diagnosis not present

## 2020-09-11 NOTE — Progress Notes (Signed)
Endocrinology follow-up note    09/11/2020, 11:59 AM  Janice [name] is a 87 y.o.-year-old female, being seen in follow-up for hypercalcemia/hyperparathyroidism. PMD: Janice Burly, MD .   Past Medical History:  Diagnosis Date  . Age-related osteoporosis without current pathological fracture   . Essential hypertension   . Hypercalcemia   . Low back pain   . Mixed hyperlipidemia     Past Surgical History:  Procedure Laterality Date  . COLONOSCOPY      Social History   Tobacco Use  . Smoking status: Never Smoker  Vaping Use  . Vaping Use: Never used  Substance Use Topics  . Alcohol use: Never  . Drug use: Never    Family History  Problem Relation Age of Onset  . AAA (abdominal aortic aneurysm) Mother   . CAD Father     Outpatient Encounter Medications as of 09/11/2020  Medication Sig  . alendronate (FOSAMAX) 70 MG tablet Take 70 mg by mouth once a week.  Marland Kitchen atenolol (TENORMIN) 50 MG tablet Take 50 mg by mouth daily.  . chlordiazePOXIDE (LIBRIUM) 10 MG capsule Take 10 mg by mouth daily as needed for anxiety. (Patient not taking: Reported on 09/11/2020)  . Cholecalciferol (VITAMIN D-3) 25 MCG (1000 UT) CAPS Take 1 capsule by mouth daily.  . cinacalcet (SENSIPAR) 30 MG tablet Take 1 tablet (30 mg total) by mouth daily with breakfast.  . CRANBERRY CONCENTRATE PO Take 1 tablet by mouth daily.  Marland Kitchen DICLOFENAC PO Take 1 tablet by mouth 2 (two) times daily. (Patient not taking: Reported on 09/11/2020)  . DULoxetine (CYMBALTA) 20 MG capsule Take 20 mg by mouth daily as needed. (Patient not taking: Reported on 09/11/2020)  . fenofibrate 54 MG tablet Take 1 tablet by mouth daily. (Patient not taking: Reported on 09/11/2020)  . furosemide (LASIX) 20 MG tablet Take 20 mg by mouth daily.  Marland Kitchen losartan (COZAAR) 100 MG tablet Take 100 mg by mouth daily.  . Multiple Vitamins-Minerals (OCUVITE PO) Take by mouth.  (Patient not taking: Reported on 09/11/2020)  . pantoprazole (PROTONIX) 40 MG tablet Take 40 mg by mouth daily. (Patient not taking: Reported on 09/11/2020)  . pramipexole (MIRAPEX) 0.25 MG tablet Take 1 tablet by mouth every 12 (twelve) hours. (Patient not taking: Reported on 09/11/2020)  . RABEprazole (ACIPHEX) 20 MG tablet Take 20 mg by mouth daily. (Patient not taking: Reported on 09/11/2020)  . Red Yeast Rice Extract (RED YEAST RICE PO) Take 1 tablet by mouth daily. (Patient not taking: Reported on 09/11/2020)  . traMADol (ULTRAM) 50 MG tablet Take 50 mg by mouth 2 (two) times daily. (Patient not taking: Reported on 09/11/2020)  . vitamin B-12 (CYANOCOBALAMIN) 1000 MCG tablet Take 1,000 mcg by mouth daily.   No facility-administered encounter medications on file as of 09/11/2020.    Allergies  Allergen Reactions  . Codeine   . Erythromycin   . Fish Oil   . Imdur [Isosorbide Nitrate]   . Iodine   . Penicillins   . Primidone      HPI  [place] was diagnosed with hypercalcemia in September 2021.  Her  previsit labs are consistent with hypercalcemia due to primary hyperparathyroidism.    Patient was diagnosed with primary hyperparathyroidism, not a surgical candidate.  She was started on Sensipar 30 mg p.o. daily with biochemical evidence of improvement in her calcium to 10.7 from 11.4, PTH 44 improving from 95.   -No available bone density to review, she is on alendronate 70 mg p.o. weekly.  She reports that she has taken his medication since her 28s.  She has history of wrist fracture, scoliosis with loss of height.  She is assisted by her son Janice [name] during this visit.    No history of  kidney stones.  No history of CKD.  she is not on HCTZ or other thiazide therapy.  Her vitamin D status is not known. she is not on calcium supplements,  she eats dairy and green, leafy, vegetables on average amounts.  she does not have a family history of hypercalcemia, pituitary tumors,  thyroid cancer, or osteoporosis.  -She walks with a walker due to disequilibrium.   ROS:  PE: BP (!) 162/78   Pulse (!) 52   Ht 4\' 8"  (1.422 m)   Wt 110 lb (49.9 kg)   BMI 24.66 kg/m , Body mass index is 24.66 kg/m. Wt Readings from Last 3 Encounters:  09/11/20 110 lb (49.9 kg)  06/11/20 112 lb (50.8 kg)  05/30/20 112 lb 9.6 oz (51.1 kg)     April 18, 2020: Labs show calcium of 11.4  Recent Results (from the past 2160 hour(s))  PTH, intact and calcium     Status: Abnormal   Collection Time: 09/04/20 10:19 AM  Result Value Ref Range   Calcium 10.7 (H) 8.7 - 10.3 mg/dL   PTH 44 15 - 65 pg/mL   PTH Interp Comment     Comment: Interpretation                 Intact PTH    Calcium                                 (pg/mL)      (mg/dL) Normal                          15 - 65     8.6 - 10.2 Primary Hyperparathyroidism         >65          >10.2 Secondary Hyperparathyroidism       >65          <10.2 Non-Parathyroid Hypercalcemia       <65          >10.2 Hypoparathyroidism                  <15          < 8.6 Non-Parathyroid Hypocalcemia    15 - 65          < 8.6     Assessment: 1. Hypercalcemia /hyperparathyroidism 2.  Osteoporosis  Plan: She has hypercalcemia from primary hyperparathyroidism, not a surgical candidate.  She has responded to Sensipar intervention.  She will continue on Sensipar 30 mg p.o. daily at breakfast with plan to repeat PTH/calcium in 6 months.    -She has medical history of osteoporosis on treatment.  She denies any history of nephrolithiasis.  She has taken alendronate reportedly since her 31s.  She may be considered for  drug holiday after her next bone density.  She has it done at her PMD office previously, prefer to repeated on the same machine. -She is advised to obtain bone density at least every 2 years.  I suggest her next bone density includes distal 33% of  radius for evaluation of cortical bone, which is predominantly affected by  hyperparathyroidism.   She is advised to maintain close follow-up with her PMD Janice [name].      - Time spent on this patient care encounter:  30 minutes of which 50% was spent in  counseling and the rest reviewing  her current and  previous labs / studies and medications  doses and developing a plan for long term care, and documenting this care. Cephus Slater  participated in the discussions, expressed understanding, and voiced agreement with the above plans.  All questions were answered to her satisfaction. she is encouraged to contact clinic should she have any questions or concerns prior to her return visit.    - Return in about 6 months (around 03/11/2021) for F/U with Pre-visit Labs.   Glade Lloyd, MD [facility] [address] [place], [place] [address] Phone: [phone]  Fax: [phone]    This note was partially dictated with voice recognition software. Similar sounding words can be transcribed inadequately or may not  be corrected upon review.  09/11/2020, 11:59 AM

## 2020-10-08 DIAGNOSIS — I10 Essential (primary) hypertension: Secondary | ICD-10-CM | POA: Diagnosis not present

## 2020-10-21 DIAGNOSIS — L309 Dermatitis, unspecified: Secondary | ICD-10-CM | POA: Diagnosis not present

## 2020-10-21 DIAGNOSIS — Z0000 Encounter for general adult medical examination without abnormal findings: Secondary | ICD-10-CM | POA: Diagnosis not present

## 2020-10-21 DIAGNOSIS — I872 Venous insufficiency (chronic) (peripheral): Secondary | ICD-10-CM | POA: Diagnosis not present

## 2020-10-21 DIAGNOSIS — E782 Mixed hyperlipidemia: Secondary | ICD-10-CM | POA: Diagnosis not present

## 2020-10-21 DIAGNOSIS — Z1331 Encounter for screening for depression: Secondary | ICD-10-CM | POA: Diagnosis not present

## 2020-10-21 DIAGNOSIS — M545 Low back pain, unspecified: Secondary | ICD-10-CM | POA: Diagnosis not present

## 2020-10-21 DIAGNOSIS — I10 Essential (primary) hypertension: Secondary | ICD-10-CM | POA: Diagnosis not present

## 2020-10-21 DIAGNOSIS — M810 Age-related osteoporosis without current pathological fracture: Secondary | ICD-10-CM | POA: Diagnosis not present

## 2020-12-02 DIAGNOSIS — R82998 Other abnormal findings in urine: Secondary | ICD-10-CM | POA: Diagnosis not present

## 2020-12-02 DIAGNOSIS — Z87442 Personal history of urinary calculi: Secondary | ICD-10-CM | POA: Diagnosis not present

## 2020-12-02 DIAGNOSIS — N3946 Mixed incontinence: Secondary | ICD-10-CM | POA: Diagnosis not present

## 2020-12-02 DIAGNOSIS — R3129 Other microscopic hematuria: Secondary | ICD-10-CM | POA: Diagnosis not present

## 2020-12-23 DIAGNOSIS — Z87442 Personal history of urinary calculi: Secondary | ICD-10-CM | POA: Diagnosis not present

## 2020-12-23 DIAGNOSIS — K5730 Diverticulosis of large intestine without perforation or abscess without bleeding: Secondary | ICD-10-CM | POA: Diagnosis not present

## 2020-12-23 DIAGNOSIS — N200 Calculus of kidney: Secondary | ICD-10-CM | POA: Diagnosis not present

## 2020-12-23 DIAGNOSIS — R918 Other nonspecific abnormal finding of lung field: Secondary | ICD-10-CM | POA: Diagnosis not present

## 2020-12-23 DIAGNOSIS — R3129 Other microscopic hematuria: Secondary | ICD-10-CM | POA: Diagnosis not present

## 2020-12-23 DIAGNOSIS — R82998 Other abnormal findings in urine: Secondary | ICD-10-CM | POA: Diagnosis not present

## 2020-12-23 DIAGNOSIS — M419 Scoliosis, unspecified: Secondary | ICD-10-CM | POA: Diagnosis not present

## 2020-12-24 DIAGNOSIS — N3001 Acute cystitis with hematuria: Secondary | ICD-10-CM | POA: Diagnosis not present

## 2020-12-24 DIAGNOSIS — N3946 Mixed incontinence: Secondary | ICD-10-CM | POA: Diagnosis not present

## 2020-12-24 DIAGNOSIS — N200 Calculus of kidney: Secondary | ICD-10-CM | POA: Diagnosis not present

## 2021-01-18 DIAGNOSIS — D839 Common variable immunodeficiency, unspecified: Secondary | ICD-10-CM | POA: Diagnosis not present

## 2021-01-18 DIAGNOSIS — M059 Rheumatoid arthritis with rheumatoid factor, unspecified: Secondary | ICD-10-CM | POA: Diagnosis not present

## 2021-01-18 DIAGNOSIS — Z8349 Family history of other endocrine, nutritional and metabolic diseases: Secondary | ICD-10-CM | POA: Diagnosis not present

## 2021-01-21 ENCOUNTER — Other Ambulatory Visit: Payer: Self-pay | Admitting: "Endocrinology

## 2021-01-21 DIAGNOSIS — E210 Primary hyperparathyroidism: Secondary | ICD-10-CM

## 2021-01-28 DIAGNOSIS — I10 Essential (primary) hypertension: Secondary | ICD-10-CM | POA: Diagnosis not present

## 2021-01-28 DIAGNOSIS — M545 Low back pain, unspecified: Secondary | ICD-10-CM | POA: Diagnosis not present

## 2021-01-28 DIAGNOSIS — I872 Venous insufficiency (chronic) (peripheral): Secondary | ICD-10-CM | POA: Diagnosis not present

## 2021-01-28 DIAGNOSIS — M810 Age-related osteoporosis without current pathological fracture: Secondary | ICD-10-CM | POA: Diagnosis not present

## 2021-01-28 DIAGNOSIS — E782 Mixed hyperlipidemia: Secondary | ICD-10-CM | POA: Diagnosis not present

## 2021-02-04 DIAGNOSIS — Z23 Encounter for immunization: Secondary | ICD-10-CM | POA: Diagnosis not present

## 2021-02-04 DIAGNOSIS — Z1231 Encounter for screening mammogram for malignant neoplasm of breast: Secondary | ICD-10-CM | POA: Diagnosis not present

## 2021-03-04 LAB — PTH, INTACT AND CALCIUM
Calcium: 10.8 mg/dL — ABNORMAL HIGH (ref 8.7–10.3)
PTH: 32 pg/mL (ref 15–65)

## 2021-03-11 ENCOUNTER — Ambulatory Visit (INDEPENDENT_AMBULATORY_CARE_PROVIDER_SITE_OTHER): Payer: Medicare Other | Admitting: "Endocrinology

## 2021-03-11 ENCOUNTER — Encounter: Payer: Self-pay | Admitting: "Endocrinology

## 2021-03-11 ENCOUNTER — Other Ambulatory Visit: Payer: Self-pay

## 2021-03-11 VITALS — BP 152/78 | HR 60 | Ht <= 58 in | Wt 114.6 lb

## 2021-03-11 DIAGNOSIS — E210 Primary hyperparathyroidism: Secondary | ICD-10-CM

## 2021-03-11 NOTE — Progress Notes (Signed)
Endocrinology follow-up note    03/11/2021, 1:06 PM  [name] is a 87 y.o.-year-old female, being seen in follow-up for hypercalcemia/hyperparathyroidism. PMD: Neale Burly, MD .   Past Medical History:  Diagnosis Date   Age-related osteoporosis without current pathological fracture    Essential hypertension    Hypercalcemia    Low back pain    Mixed hyperlipidemia     Past Surgical History:  Procedure Laterality Date   COLONOSCOPY      Social History   Tobacco Use   Smoking status: Never  Vaping Use   Vaping Use: Never used  Substance Use Topics   Alcohol use: Never   Drug use: Never    Family History  Problem Relation Age of Onset   AAA (abdominal aortic aneurysm) Mother    CAD Father     Outpatient Encounter Medications as of 03/11/2021  Medication Sig   chlordiazePOXIDE (LIBRIUM) 10 MG capsule Take 10 mg by mouth daily as needed for anxiety.   fenofibrate 54 MG tablet Take 1 tablet by mouth daily.   RABEprazole (ACIPHEX) 20 MG tablet Take 20 mg by mouth daily.   alendronate (FOSAMAX) 70 MG tablet Take 70 mg by mouth once a week.   atenolol (TENORMIN) 50 MG tablet Take 50 mg by mouth daily.   Cholecalciferol (VITAMIN D-3) 25 MCG (1000 UT) CAPS Take 1 capsule by mouth daily.   cinacalcet (SENSIPAR) 30 MG tablet TAKE 1 TABLET BY MOUTH 1 TIME A DAY WITH BREAKFAST   CRANBERRY CONCENTRATE PO Take 1 tablet by mouth daily.   DICLOFENAC PO Take 1 tablet by mouth 2 (two) times daily. (Patient not taking: Reported on 09/11/2020)   DULoxetine (CYMBALTA) 20 MG capsule Take 20 mg by mouth daily as needed. (Patient not taking: Reported on 09/11/2020)   furosemide (LASIX) 20 MG tablet Take 20 mg by mouth daily.   losartan (COZAAR) 100 MG tablet Take 100 mg by mouth daily.   Multiple Vitamins-Minerals (OCUVITE PO) Take by mouth. (Patient not taking: Reported on 09/11/2020)   pantoprazole  (PROTONIX) 40 MG tablet Take 40 mg by mouth daily. (Patient not taking: Reported on 09/11/2020)   pramipexole (MIRAPEX) 0.25 MG tablet Take 1 tablet by mouth every 12 (twelve) hours. (Patient not taking: Reported on 09/11/2020)   Red Yeast Rice Extract (RED YEAST RICE PO) Take 1 tablet by mouth daily. (Patient not taking: Reported on 09/11/2020)   traMADol (ULTRAM) 50 MG tablet Take 50 mg by mouth 2 (two) times daily. (Patient not taking: Reported on 09/11/2020)   vitamin B-12 (CYANOCOBALAMIN) 1000 MCG tablet Take 1,000 mcg by mouth daily.   No facility-administered encounter medications on file as of 03/11/2021.    Allergies  Allergen Reactions   Codeine    Erythromycin    Fish Oil    Imdur [Isosorbide Nitrate]    Iodine    Penicillins    Primidone      HPI  Janice [name] was diagnosed with hypercalcemia in September 2021.  Her previsit labs are consistent with hypercalcemia due to primary hyperparathyroidism.    Patient was diagnosed with primary hyperparathyroidism,  not a surgical candidate.  She was started on Sensipar 30 mg p.o. daily with biochemical evidence of improvement in her calcium to 10.8 from 11.4, PTH 32 improving from 95.    -No available bone density to review, she is on alendronate 70 mg p.o. weekly.  She reports that she has taken his medication since her 88s.  She has history of wrist fracture, scoliosis with loss of height.  She is assisted by her son [name] during this visit.    No history of  kidney stones.  No history of CKD.  she is not on HCTZ or other thiazide therapy.  Her vitamin D status is not known. she is not on calcium supplements,  she eats dairy and green, leafy, vegetables on average amounts.  she does not have a family history of hypercalcemia, pituitary tumors, thyroid cancer, or osteoporosis.  -She walks with a walker due to disequilibrium.   ROS:  PE: BP (!) 152/78   Pulse 60   Ht '4\' 8"'$  (1.422 m)   Wt 114 lb 9.6 oz (52 kg)    BMI 25.69 kg/m , Body mass index is 25.69 kg/m. Wt Readings from Last 3 Encounters:  03/11/21 114 lb 9.6 oz (52 kg)  09/11/20 110 lb (49.9 kg)  06/11/20 112 lb (50.8 kg)     April 18, 2020: Labs show calcium of 11.4  Recent Results (from the past 2160 hour(s))  PTH, intact and calcium     Status: Abnormal   Collection Time: 03/03/21 11:24 AM  Result Value Ref Range   Calcium 10.8 (H) 8.7 - 10.3 mg/dL   PTH 32 15 - 65 pg/mL   PTH Interp Comment     Comment: Interpretation                 Intact PTH    Calcium                                 (pg/mL)      (mg/dL) Normal                          15 - 65     8.6 - 10.2 Primary Hyperparathyroidism         >65          >10.2 Secondary Hyperparathyroidism       >65          <10.2 Non-Parathyroid Hypercalcemia       <65          >10.2 Hypoparathyroidism                  <15          < 8.6 Non-Parathyroid Hypocalcemia    15 - 65          < 8.6     Assessment: 1. Hypercalcemia /hyperparathyroidism 2.  Osteoporosis  Plan: She has hypercalcemia from primary hyperparathyroidism, not a surgical candidate.  She has responded to Sensipar intervention.  She will continue on Sensipar 30 mg p.o. daily at breakfast  with plan to repeat PTH/calcium in 6 months.    -She has medical history of osteoporosis on treatment.  She denies any history of nephrolithiasis.  She has taken alendronate reportedly since her 31s.  She may be considered for drug holiday after her next bone density.  She has it done at her PMD office  previously, prefer to repeated on the same machine. -She is advised to obtain bone density at least every 2 years-reports that her neck study is in October 2022 at her PMD office.  I suggest her next bone density includes distal 33% of  radius for evaluation of cortical bone, which is predominantly affected by hyperparathyroidism.   She is advised to maintain close follow-up with her PMD Dr. Sherrie Sport.    I spent 22 minutes in the  care of the patient today including review of labs from Thyroid Function, CMP, and other relevant labs ; imaging/biopsy records (current and previous including abstractions from other facilities); face-to-face time discussing  her lab results and symptoms, medications doses, her options of short and long term treatment based on the latest standards of care / guidelines;   and documenting the encounter.  Cephus Slater  participated in the discussions, expressed understanding, and voiced agreement with the above plans.  All questions were answered to her satisfaction. she is encouraged to contact clinic should she have any questions or concerns prior to her return visit.    - Return in about 6 months (around 09/11/2021) for F/U with Pre-visit Labs.   Glade Lloyd, MD [facility] [address] [place], [place] [address] Phone: [phone]  Fax: [phone]    This note was partially dictated with voice recognition software. Similar sounding words can be transcribed inadequately or may not  be corrected upon review.  03/11/2021, 1:06 PM

## 2021-03-20 DIAGNOSIS — H40033 Anatomical narrow angle, bilateral: Secondary | ICD-10-CM | POA: Diagnosis not present

## 2021-03-20 DIAGNOSIS — H04123 Dry eye syndrome of bilateral lacrimal glands: Secondary | ICD-10-CM | POA: Diagnosis not present

## 2021-03-27 ENCOUNTER — Telehealth: Payer: Self-pay | Admitting: "Endocrinology

## 2021-03-27 ENCOUNTER — Other Ambulatory Visit: Payer: Self-pay | Admitting: "Endocrinology

## 2021-03-27 DIAGNOSIS — Z9181 History of falling: Secondary | ICD-10-CM | POA: Diagnosis not present

## 2021-03-27 DIAGNOSIS — I10 Essential (primary) hypertension: Secondary | ICD-10-CM | POA: Diagnosis not present

## 2021-03-27 DIAGNOSIS — E210 Primary hyperparathyroidism: Secondary | ICD-10-CM

## 2021-03-27 DIAGNOSIS — I872 Venous insufficiency (chronic) (peripheral): Secondary | ICD-10-CM | POA: Diagnosis not present

## 2021-03-27 DIAGNOSIS — M810 Age-related osteoporosis without current pathological fracture: Secondary | ICD-10-CM | POA: Diagnosis not present

## 2021-03-27 DIAGNOSIS — M545 Low back pain, unspecified: Secondary | ICD-10-CM | POA: Diagnosis not present

## 2021-03-27 MED ORDER — CINACALCET HCL 30 MG PO TABS
ORAL_TABLET | ORAL | 1 refills | Status: DC
Start: 2021-03-27 — End: 2021-09-16

## 2021-03-27 NOTE — Telephone Encounter (Signed)
Larrie Kass called from [facility] in regards to clarifying patient medication. She states they have '90mg'$  tablets but the instructions I see in July was '30mg'$  tablet sent in to take 1x a day with breakfast. She is needing the correct order faxed to her [phone]  cinacalcet (SENSIPAR) 30 MG tablet

## 2021-03-27 NOTE — Telephone Encounter (Signed)
They need a written order faxed to them.

## 2021-03-27 NOTE — Telephone Encounter (Signed)
Order faxed.

## 2021-03-31 DIAGNOSIS — M545 Low back pain, unspecified: Secondary | ICD-10-CM | POA: Diagnosis not present

## 2021-03-31 DIAGNOSIS — I10 Essential (primary) hypertension: Secondary | ICD-10-CM | POA: Diagnosis not present

## 2021-03-31 DIAGNOSIS — I872 Venous insufficiency (chronic) (peripheral): Secondary | ICD-10-CM | POA: Diagnosis not present

## 2021-03-31 DIAGNOSIS — M810 Age-related osteoporosis without current pathological fracture: Secondary | ICD-10-CM | POA: Diagnosis not present

## 2021-03-31 DIAGNOSIS — Z9181 History of falling: Secondary | ICD-10-CM | POA: Diagnosis not present

## 2021-04-02 DIAGNOSIS — I10 Essential (primary) hypertension: Secondary | ICD-10-CM | POA: Diagnosis not present

## 2021-04-02 DIAGNOSIS — Z9181 History of falling: Secondary | ICD-10-CM | POA: Diagnosis not present

## 2021-04-02 DIAGNOSIS — M810 Age-related osteoporosis without current pathological fracture: Secondary | ICD-10-CM | POA: Diagnosis not present

## 2021-04-02 DIAGNOSIS — I872 Venous insufficiency (chronic) (peripheral): Secondary | ICD-10-CM | POA: Diagnosis not present

## 2021-04-02 DIAGNOSIS — M545 Low back pain, unspecified: Secondary | ICD-10-CM | POA: Diagnosis not present

## 2021-04-07 DIAGNOSIS — M545 Low back pain, unspecified: Secondary | ICD-10-CM | POA: Diagnosis not present

## 2021-04-07 DIAGNOSIS — I10 Essential (primary) hypertension: Secondary | ICD-10-CM | POA: Diagnosis not present

## 2021-04-07 DIAGNOSIS — M810 Age-related osteoporosis without current pathological fracture: Secondary | ICD-10-CM | POA: Diagnosis not present

## 2021-04-07 DIAGNOSIS — I872 Venous insufficiency (chronic) (peripheral): Secondary | ICD-10-CM | POA: Diagnosis not present

## 2021-04-07 DIAGNOSIS — Z9181 History of falling: Secondary | ICD-10-CM | POA: Diagnosis not present

## 2021-04-10 DIAGNOSIS — M810 Age-related osteoporosis without current pathological fracture: Secondary | ICD-10-CM | POA: Diagnosis not present

## 2021-04-10 DIAGNOSIS — I872 Venous insufficiency (chronic) (peripheral): Secondary | ICD-10-CM | POA: Diagnosis not present

## 2021-04-10 DIAGNOSIS — M545 Low back pain, unspecified: Secondary | ICD-10-CM | POA: Diagnosis not present

## 2021-04-10 DIAGNOSIS — I10 Essential (primary) hypertension: Secondary | ICD-10-CM | POA: Diagnosis not present

## 2021-04-10 DIAGNOSIS — Z9181 History of falling: Secondary | ICD-10-CM | POA: Diagnosis not present

## 2021-04-14 DIAGNOSIS — I10 Essential (primary) hypertension: Secondary | ICD-10-CM | POA: Diagnosis not present

## 2021-04-14 DIAGNOSIS — I872 Venous insufficiency (chronic) (peripheral): Secondary | ICD-10-CM | POA: Diagnosis not present

## 2021-04-14 DIAGNOSIS — M545 Low back pain, unspecified: Secondary | ICD-10-CM | POA: Diagnosis not present

## 2021-04-14 DIAGNOSIS — Z9181 History of falling: Secondary | ICD-10-CM | POA: Diagnosis not present

## 2021-04-14 DIAGNOSIS — M810 Age-related osteoporosis without current pathological fracture: Secondary | ICD-10-CM | POA: Diagnosis not present

## 2021-04-15 DIAGNOSIS — Z23 Encounter for immunization: Secondary | ICD-10-CM | POA: Diagnosis not present

## 2021-04-17 DIAGNOSIS — Z9181 History of falling: Secondary | ICD-10-CM | POA: Diagnosis not present

## 2021-04-17 DIAGNOSIS — I10 Essential (primary) hypertension: Secondary | ICD-10-CM | POA: Diagnosis not present

## 2021-04-17 DIAGNOSIS — I872 Venous insufficiency (chronic) (peripheral): Secondary | ICD-10-CM | POA: Diagnosis not present

## 2021-04-17 DIAGNOSIS — M810 Age-related osteoporosis without current pathological fracture: Secondary | ICD-10-CM | POA: Diagnosis not present

## 2021-04-17 DIAGNOSIS — M545 Low back pain, unspecified: Secondary | ICD-10-CM | POA: Diagnosis not present

## 2021-04-18 DIAGNOSIS — I10 Essential (primary) hypertension: Secondary | ICD-10-CM | POA: Diagnosis not present

## 2021-04-18 DIAGNOSIS — E7849 Other hyperlipidemia: Secondary | ICD-10-CM | POA: Diagnosis not present

## 2021-04-18 DIAGNOSIS — M810 Age-related osteoporosis without current pathological fracture: Secondary | ICD-10-CM | POA: Diagnosis not present

## 2021-04-22 DIAGNOSIS — M810 Age-related osteoporosis without current pathological fracture: Secondary | ICD-10-CM | POA: Diagnosis not present

## 2021-04-22 DIAGNOSIS — Z9181 History of falling: Secondary | ICD-10-CM | POA: Diagnosis not present

## 2021-04-22 DIAGNOSIS — M545 Low back pain, unspecified: Secondary | ICD-10-CM | POA: Diagnosis not present

## 2021-04-22 DIAGNOSIS — I872 Venous insufficiency (chronic) (peripheral): Secondary | ICD-10-CM | POA: Diagnosis not present

## 2021-04-22 DIAGNOSIS — I10 Essential (primary) hypertension: Secondary | ICD-10-CM | POA: Diagnosis not present

## 2021-04-23 DIAGNOSIS — I517 Cardiomegaly: Secondary | ICD-10-CM | POA: Diagnosis not present

## 2021-04-23 DIAGNOSIS — J811 Chronic pulmonary edema: Secondary | ICD-10-CM | POA: Diagnosis not present

## 2021-04-23 DIAGNOSIS — N200 Calculus of kidney: Secondary | ICD-10-CM | POA: Diagnosis not present

## 2021-04-23 DIAGNOSIS — E041 Nontoxic single thyroid nodule: Secondary | ICD-10-CM | POA: Diagnosis not present

## 2021-04-23 DIAGNOSIS — R918 Other nonspecific abnormal finding of lung field: Secondary | ICD-10-CM | POA: Diagnosis not present

## 2021-04-24 DIAGNOSIS — M810 Age-related osteoporosis without current pathological fracture: Secondary | ICD-10-CM | POA: Diagnosis not present

## 2021-04-24 DIAGNOSIS — I872 Venous insufficiency (chronic) (peripheral): Secondary | ICD-10-CM | POA: Diagnosis not present

## 2021-04-24 DIAGNOSIS — M545 Low back pain, unspecified: Secondary | ICD-10-CM | POA: Diagnosis not present

## 2021-04-24 DIAGNOSIS — I10 Essential (primary) hypertension: Secondary | ICD-10-CM | POA: Diagnosis not present

## 2021-04-24 DIAGNOSIS — Z9181 History of falling: Secondary | ICD-10-CM | POA: Diagnosis not present

## 2021-04-26 DIAGNOSIS — M810 Age-related osteoporosis without current pathological fracture: Secondary | ICD-10-CM | POA: Diagnosis not present

## 2021-04-26 DIAGNOSIS — M545 Low back pain, unspecified: Secondary | ICD-10-CM | POA: Diagnosis not present

## 2021-04-26 DIAGNOSIS — Z9181 History of falling: Secondary | ICD-10-CM | POA: Diagnosis not present

## 2021-04-26 DIAGNOSIS — I10 Essential (primary) hypertension: Secondary | ICD-10-CM | POA: Diagnosis not present

## 2021-04-26 DIAGNOSIS — I872 Venous insufficiency (chronic) (peripheral): Secondary | ICD-10-CM | POA: Diagnosis not present

## 2021-04-29 DIAGNOSIS — I872 Venous insufficiency (chronic) (peripheral): Secondary | ICD-10-CM | POA: Diagnosis not present

## 2021-04-29 DIAGNOSIS — M810 Age-related osteoporosis without current pathological fracture: Secondary | ICD-10-CM | POA: Diagnosis not present

## 2021-04-29 DIAGNOSIS — M545 Low back pain, unspecified: Secondary | ICD-10-CM | POA: Diagnosis not present

## 2021-04-29 DIAGNOSIS — E782 Mixed hyperlipidemia: Secondary | ICD-10-CM | POA: Diagnosis not present

## 2021-04-29 DIAGNOSIS — Z9181 History of falling: Secondary | ICD-10-CM | POA: Diagnosis not present

## 2021-04-29 DIAGNOSIS — I10 Essential (primary) hypertension: Secondary | ICD-10-CM | POA: Diagnosis not present

## 2021-05-02 DIAGNOSIS — Z66 Do not resuscitate: Secondary | ICD-10-CM | POA: Diagnosis not present

## 2021-05-02 DIAGNOSIS — S3991XA Unspecified injury of abdomen, initial encounter: Secondary | ICD-10-CM | POA: Diagnosis not present

## 2021-05-02 DIAGNOSIS — N200 Calculus of kidney: Secondary | ICD-10-CM | POA: Diagnosis not present

## 2021-05-02 DIAGNOSIS — M1128 Other chondrocalcinosis, vertebrae: Secondary | ICD-10-CM | POA: Diagnosis not present

## 2021-05-02 DIAGNOSIS — K7689 Other specified diseases of liver: Secondary | ICD-10-CM | POA: Diagnosis not present

## 2021-05-02 DIAGNOSIS — Z79899 Other long term (current) drug therapy: Secondary | ICD-10-CM | POA: Diagnosis not present

## 2021-05-02 DIAGNOSIS — R918 Other nonspecific abnormal finding of lung field: Secondary | ICD-10-CM | POA: Diagnosis not present

## 2021-05-02 DIAGNOSIS — Z90710 Acquired absence of both cervix and uterus: Secondary | ICD-10-CM | POA: Diagnosis not present

## 2021-05-02 DIAGNOSIS — S0990XA Unspecified injury of head, initial encounter: Secondary | ICD-10-CM | POA: Diagnosis not present

## 2021-05-02 DIAGNOSIS — K319 Disease of stomach and duodenum, unspecified: Secondary | ICD-10-CM | POA: Diagnosis not present

## 2021-05-02 DIAGNOSIS — E785 Hyperlipidemia, unspecified: Secondary | ICD-10-CM | POA: Diagnosis not present

## 2021-05-02 DIAGNOSIS — M5136 Other intervertebral disc degeneration, lumbar region: Secondary | ICD-10-CM | POA: Diagnosis not present

## 2021-05-02 DIAGNOSIS — K5730 Diverticulosis of large intestine without perforation or abscess without bleeding: Secondary | ICD-10-CM | POA: Diagnosis not present

## 2021-05-02 DIAGNOSIS — Z9049 Acquired absence of other specified parts of digestive tract: Secondary | ICD-10-CM | POA: Diagnosis not present

## 2021-05-02 DIAGNOSIS — I700 Atherosclerosis of aorta: Secondary | ICD-10-CM | POA: Diagnosis not present

## 2021-05-02 DIAGNOSIS — K3189 Other diseases of stomach and duodenum: Secondary | ICD-10-CM | POA: Diagnosis not present

## 2021-05-02 DIAGNOSIS — M47816 Spondylosis without myelopathy or radiculopathy, lumbar region: Secondary | ICD-10-CM | POA: Diagnosis not present

## 2021-05-02 DIAGNOSIS — W1839XA Other fall on same level, initial encounter: Secondary | ICD-10-CM | POA: Diagnosis not present

## 2021-05-02 DIAGNOSIS — I119 Hypertensive heart disease without heart failure: Secondary | ICD-10-CM | POA: Diagnosis not present

## 2021-05-02 DIAGNOSIS — I3481 Nonrheumatic mitral (valve) annulus calcification: Secondary | ICD-10-CM | POA: Diagnosis not present

## 2021-05-02 DIAGNOSIS — S2232XA Fracture of one rib, left side, initial encounter for closed fracture: Secondary | ICD-10-CM | POA: Diagnosis not present

## 2021-05-02 DIAGNOSIS — S301XXA Contusion of abdominal wall, initial encounter: Secondary | ICD-10-CM | POA: Diagnosis not present

## 2021-05-02 DIAGNOSIS — M4186 Other forms of scoliosis, lumbar region: Secondary | ICD-10-CM | POA: Diagnosis not present

## 2021-05-06 DIAGNOSIS — M545 Low back pain, unspecified: Secondary | ICD-10-CM | POA: Diagnosis not present

## 2021-05-06 DIAGNOSIS — M810 Age-related osteoporosis without current pathological fracture: Secondary | ICD-10-CM | POA: Diagnosis not present

## 2021-05-06 DIAGNOSIS — Z9181 History of falling: Secondary | ICD-10-CM | POA: Diagnosis not present

## 2021-05-06 DIAGNOSIS — I10 Essential (primary) hypertension: Secondary | ICD-10-CM | POA: Diagnosis not present

## 2021-05-06 DIAGNOSIS — I872 Venous insufficiency (chronic) (peripheral): Secondary | ICD-10-CM | POA: Diagnosis not present

## 2021-05-08 DIAGNOSIS — S2242XA Multiple fractures of ribs, left side, initial encounter for closed fracture: Secondary | ICD-10-CM | POA: Diagnosis not present

## 2021-05-12 DIAGNOSIS — I10 Essential (primary) hypertension: Secondary | ICD-10-CM | POA: Diagnosis not present

## 2021-05-12 DIAGNOSIS — Z9181 History of falling: Secondary | ICD-10-CM | POA: Diagnosis not present

## 2021-05-12 DIAGNOSIS — I872 Venous insufficiency (chronic) (peripheral): Secondary | ICD-10-CM | POA: Diagnosis not present

## 2021-05-12 DIAGNOSIS — M810 Age-related osteoporosis without current pathological fracture: Secondary | ICD-10-CM | POA: Diagnosis not present

## 2021-05-12 DIAGNOSIS — M545 Low back pain, unspecified: Secondary | ICD-10-CM | POA: Diagnosis not present

## 2021-05-21 DIAGNOSIS — M810 Age-related osteoporosis without current pathological fracture: Secondary | ICD-10-CM | POA: Diagnosis not present

## 2021-05-21 DIAGNOSIS — I872 Venous insufficiency (chronic) (peripheral): Secondary | ICD-10-CM | POA: Diagnosis not present

## 2021-05-21 DIAGNOSIS — I10 Essential (primary) hypertension: Secondary | ICD-10-CM | POA: Diagnosis not present

## 2021-05-21 DIAGNOSIS — Z9181 History of falling: Secondary | ICD-10-CM | POA: Diagnosis not present

## 2021-05-21 DIAGNOSIS — M545 Low back pain, unspecified: Secondary | ICD-10-CM | POA: Diagnosis not present

## 2021-05-28 ENCOUNTER — Telehealth: Payer: Self-pay

## 2021-05-28 NOTE — Telephone Encounter (Signed)
Received a fax for a prior authorization of cinacalcet and sent in today. Received a notice of approval fax with the approval dates of 05/28/2021 to 05/28/2022.

## 2021-06-03 DIAGNOSIS — M810 Age-related osteoporosis without current pathological fracture: Secondary | ICD-10-CM | POA: Diagnosis not present

## 2021-06-18 DIAGNOSIS — E7849 Other hyperlipidemia: Secondary | ICD-10-CM | POA: Diagnosis not present

## 2021-06-18 DIAGNOSIS — I10 Essential (primary) hypertension: Secondary | ICD-10-CM | POA: Diagnosis not present

## 2021-08-12 DIAGNOSIS — M79675 Pain in left toe(s): Secondary | ICD-10-CM | POA: Diagnosis not present

## 2021-08-12 DIAGNOSIS — M79674 Pain in right toe(s): Secondary | ICD-10-CM | POA: Diagnosis not present

## 2021-08-12 DIAGNOSIS — B351 Tinea unguium: Secondary | ICD-10-CM | POA: Diagnosis not present

## 2021-09-01 DIAGNOSIS — I10 Essential (primary) hypertension: Secondary | ICD-10-CM | POA: Diagnosis not present

## 2021-09-01 DIAGNOSIS — I872 Venous insufficiency (chronic) (peripheral): Secondary | ICD-10-CM | POA: Diagnosis not present

## 2021-09-08 DIAGNOSIS — E210 Primary hyperparathyroidism: Secondary | ICD-10-CM | POA: Diagnosis not present

## 2021-09-09 LAB — COMPREHENSIVE METABOLIC PANEL
ALT: 13 IU/L (ref 0–32)
AST: 21 IU/L (ref 0–40)
Albumin/Globulin Ratio: 1.6 (ref 1.2–2.2)
Albumin: 4.3 g/dL (ref 3.6–4.6)
Alkaline Phosphatase: 54 IU/L (ref 44–121)
BUN/Creatinine Ratio: 24 (ref 12–28)
BUN: 25 mg/dL (ref 8–27)
Bilirubin Total: 0.3 mg/dL (ref 0.0–1.2)
CO2: 22 mmol/L (ref 20–29)
Calcium: 11.2 mg/dL — ABNORMAL HIGH (ref 8.7–10.3)
Chloride: 104 mmol/L (ref 96–106)
Creatinine, Ser: 1.03 mg/dL — ABNORMAL HIGH (ref 0.57–1.00)
Globulin, Total: 2.7 g/dL (ref 1.5–4.5)
Glucose: 97 mg/dL (ref 70–99)
Potassium: 4.5 mmol/L (ref 3.5–5.2)
Sodium: 143 mmol/L (ref 134–144)
Total Protein: 7 g/dL (ref 6.0–8.5)
eGFR: 52 mL/min/{1.73_m2} — ABNORMAL LOW (ref >59)

## 2021-09-09 LAB — PTH, INTACT AND CALCIUM: PTH: 77 pg/mL — ABNORMAL HIGH (ref 15–65)

## 2021-09-10 DIAGNOSIS — M7121 Synovial cyst of popliteal space [Baker], right knee: Secondary | ICD-10-CM | POA: Diagnosis not present

## 2021-09-10 DIAGNOSIS — M79605 Pain in left leg: Secondary | ICD-10-CM | POA: Diagnosis not present

## 2021-09-10 DIAGNOSIS — M79662 Pain in left lower leg: Secondary | ICD-10-CM | POA: Diagnosis not present

## 2021-09-10 DIAGNOSIS — R600 Localized edema: Secondary | ICD-10-CM | POA: Diagnosis not present

## 2021-09-10 DIAGNOSIS — M79604 Pain in right leg: Secondary | ICD-10-CM | POA: Diagnosis not present

## 2021-09-11 ENCOUNTER — Ambulatory Visit: Payer: Medicare Other | Admitting: "Endocrinology

## 2021-09-16 ENCOUNTER — Encounter: Payer: Self-pay | Admitting: "Endocrinology

## 2021-09-16 ENCOUNTER — Ambulatory Visit (INDEPENDENT_AMBULATORY_CARE_PROVIDER_SITE_OTHER): Payer: Medicare Other | Admitting: "Endocrinology

## 2021-09-16 VITALS — BP 176/50 | HR 55 | Ht <= 58 in | Wt 137.2 lb

## 2021-09-16 DIAGNOSIS — E210 Primary hyperparathyroidism: Secondary | ICD-10-CM

## 2021-09-16 DIAGNOSIS — M810 Age-related osteoporosis without current pathological fracture: Secondary | ICD-10-CM

## 2021-09-16 MED ORDER — CINACALCET HCL 30 MG PO TABS: 30.0000 mg | ORAL_TABLET | Freq: Two times a day (BID) | ORAL | 1 refills | Status: DC

## 2021-09-16 NOTE — Progress Notes (Signed)
Endocrinology follow-up note    09/16/2021, 4:09 PM  Janice [name] is a 88 y.o.-year-old female, being seen in follow-up for hypercalcemia/hyperparathyroidism. PMD: Janice Burly, MD .   Past Medical History:  Diagnosis Date   Age-related osteoporosis without current pathological fracture    Essential hypertension    Hypercalcemia    Low back pain    Mixed hyperlipidemia     Past Surgical History:  Procedure Laterality Date   COLONOSCOPY     GALLBLADDER SURGERY     December 2021    Social History   Tobacco Use   Smoking status: Never  Vaping Use   Vaping Use: Never used  Substance Use Topics   Alcohol use: Never   Drug use: Never    Family History  Problem Relation Age of Onset   AAA (abdominal aortic aneurysm) Mother    CAD Father     Outpatient Encounter Medications as of 09/16/2021  Medication Sig   alendronate (FOSAMAX) 70 MG tablet Take 70 mg by mouth once a week.   atenolol (TENORMIN) 50 MG tablet Take 50 mg by mouth daily.   Cholecalciferol (VITAMIN D-3) 25 MCG (1000 UT) CAPS Take 1 capsule by mouth daily.   CRANBERRY CONCENTRATE PO Take 1 tablet by mouth daily.   fenofibrate 54 MG tablet Take 1 tablet by mouth daily.   fluticasone (FLONASE) 50 MCG/ACT nasal spray Place 1 spray into both nostrils daily.   furosemide (LASIX) 20 MG tablet Take 20 mg by mouth daily.   Multiple Vitamins-Minerals (OCUVITE PO) Take by mouth.   olmesartan (BENICAR) 40 MG tablet Take 40 mg by mouth daily.   pantoprazole (PROTONIX) 40 MG tablet Take 40 mg by mouth daily.   vitamin B-12 (CYANOCOBALAMIN) 1000 MCG tablet Take 1,000 mcg by mouth daily.   [DISCONTINUED] cinacalcet (SENSIPAR) 30 MG tablet TAKE 1 TABLET BY MOUTH 1 TIME A DAY WITH BREAKFAST   chlordiazePOXIDE (LIBRIUM) 10 MG capsule Take 10 mg by mouth daily as needed for anxiety. (Patient not taking: Reported on 09/16/2021)   cinacalcet  (SENSIPAR) 30 MG tablet Take 1 tablet (30 mg total) by mouth 2 (two) times daily with a meal. TAKE 1 TABLET BY MOUTH 1 TIME A DAY WITH BREAKFAST   DICLOFENAC PO Take 1 tablet by mouth 2 (two) times daily. (Patient not taking: Reported on 09/11/2020)   DULoxetine (CYMBALTA) 20 MG capsule Take 20 mg by mouth daily as needed. (Patient not taking: Reported on 09/11/2020)   losartan (COZAAR) 100 MG tablet Take 100 mg by mouth daily. (Patient not taking: Reported on 09/16/2021)   pramipexole (MIRAPEX) 0.25 MG tablet Take 1 tablet by mouth every 12 (twelve) hours. (Patient not taking: Reported on 09/11/2020)   RABEprazole (ACIPHEX) 20 MG tablet Take 20 mg by mouth daily. (Patient not taking: Reported on 09/16/2021)   Red Yeast Rice Extract (RED YEAST RICE PO) Take 1 tablet by mouth daily. (Patient not taking: Reported on 09/11/2020)   traMADol (ULTRAM) 50 MG tablet Take 50 mg by mouth 2 (two) times daily. (Patient not taking: Reported on 09/11/2020)   No facility-administered encounter medications on file  as of 09/16/2021.    Allergies  Allergen Reactions   Iodinated Contrast Media Swelling    Other reaction(s): Hypotension, Other (see comments) Other reaction(s): Hypotension Other reaction(s): Hypotension    Iodine Swelling    Other reaction(s): Abdominal Pain, Other (see comments) Other reaction(s): Abdominal Pain Other reaction(s): Abdominal Pain    Nifedipine Swelling    Other reaction(s): Other (see comments) Blood vessels swelled large in legs Blood vessels swelled large in legs Blood vessels swelled large in legs Blood vessels swelled large in legs    Penicillins Hives and Rash   Primidone     Other reaction(s): Hypertension, Other (see comments) Stroke type symptoms, slurred speech, weakness Other reaction(s): Hypertension Stroke type symptoms, slurred speech, weakness Stroke type symptoms, slurred speech, weakness Other reaction(s): Hypertension Stroke type symptoms, slurred speech,  weakness    Codeine Nausea And Vomiting   Erythromycin Nausea And Vomiting and Rash   Isosorbide     Other reaction(s): Other (see comments) Not sure Not sure Not sure Not sure    Peanut Oil     Other reaction(s): Other (see comments) Stomach cramps Stomach cramps Stomach cramps Stomach cramps    Fish Oil    Imdur [Isosorbide Nitrate]      HPI  Janice [name] was diagnosed with hypercalcemia in September 2021.  Her previsit labs are consistent with hypercalcemia due to primary hyperparathyroidism.    Patient was diagnosed with primary hyperparathyroidism, not a surgical candidate.  She was started on Sensipar 30 mg p.o. daily. She is accompanied by her son to clinic.  Her previsit labs show higher calcium of 11.2 associated with high PTH of 77.  During her last visit, her calcium was 10.8 and PTH was 32.   -Reportedly, she underwent bone density at her primary care doctor's office.  Reports are not available to review today.   she is on alendronate 70 mg p.o. weekly.  She reports that she has taken his medication since her 41s.  She has history of wrist fracture, scoliosis with loss of height.  She is assisted by her son Janice [name] during this visit.    No history of  kidney stones.  No history of CKD.  she is not on HCTZ or other thiazide therapy.  Her vitamin D status is not known. she is not on calcium supplements,  she eats dairy and green, leafy, vegetables on average amounts.  she does not have a family history of hypercalcemia, pituitary tumors, thyroid cancer, or osteoporosis.  -She walks with a walker due to disequilibrium.   ROS:  PE: BP (!) 176/50    Pulse (!) 55    Ht 4' 8"  (1.422 m)    Wt 137 lb 3.2 oz (62.2 kg)    SpO2 95%    BMI 30.76 kg/m , Body mass index is 30.76 kg/m. Wt Readings from Last 3 Encounters:  09/16/21 137 lb 3.2 oz (62.2 kg)  03/11/21 114 lb 9.6 oz (52 kg)  09/11/20 110 lb (49.9 kg)     April 18, 2020: Labs show calcium  of 11.4  Recent Results (from the past 2160 hour(s))  PTH, intact and calcium     Status: Abnormal   Collection Time: 09/08/21  2:20 PM  Result Value Ref Range   PTH 77 (H) 15 - 65 pg/mL   PTH Interp Comment     Comment: Interpretation                 Intact PTH  Calcium                                 (pg/mL)      (mg/dL) Normal                          15 - 65     8.6 - 10.2 Primary Hyperparathyroidism         >65          >10.2 Secondary Hyperparathyroidism       >65          <10.2 Non-Parathyroid Hypercalcemia       <65          >10.2 Hypoparathyroidism                  <15          < 8.6 Non-Parathyroid Hypocalcemia    15 - 65          < 8.6   Comprehensive metabolic panel     Status: Abnormal   Collection Time: 09/08/21  2:20 PM  Result Value Ref Range   Glucose 97 70 - 99 mg/dL   BUN 25 8 - 27 mg/dL   Creatinine, Ser 1.03 (H) 0.57 - 1.00 mg/dL   eGFR 52 (L) >59 mL/min/1.73   BUN/Creatinine Ratio 24 12 - 28   Sodium 143 134 - 144 mmol/L   Potassium 4.5 3.5 - 5.2 mmol/L   Chloride 104 96 - 106 mmol/L   CO2 22 20 - 29 mmol/L   Calcium 11.2 (H) 8.7 - 10.3 mg/dL   Total Protein 7.0 6.0 - 8.5 g/dL   Albumin 4.3 3.6 - 4.6 g/dL   Globulin, Total 2.7 1.5 - 4.5 g/dL   Albumin/Globulin Ratio 1.6 1.2 - 2.2   Bilirubin Total 0.3 0.0 - 1.2 mg/dL   Alkaline Phosphatase 54 44 - 121 IU/L   AST 21 0 - 40 IU/L   ALT 13 0 - 32 IU/L    Assessment: 1. Hypercalcemia /hyperparathyroidism 2.  Osteoporosis  Plan: She has hypercalcemia from primary hyperparathyroidism, not a surgical candidate.  She has previously responded to [place] therapy.  She is advised to increase Sensipar to 30 mg p.o. twice daily with breakfast and supper  with plan to repeat PTH/calcium in 6 months.      -She has medical history of osteoporosis on treatment.  She denies any history of nephrolithiasis.  She has taken alendronate reportedly since her 7s.  She may be considered for drug holiday after we  reviewed her bone density.  We will try to obtain a copy of her recent DEXA scan from her PMD office.  .   She is advised to maintain close follow-up with her PMD Dr. Sherrie Sport.   I spent 21 minutes in the care of the patient today including review of labs from Thyroid Function, CMP, and other relevant labs ; imaging/biopsy records (current and previous including abstractions from other facilities); face-to-face time discussing  her lab results and symptoms, medications doses, her options of short and long term treatment based on the latest standards of care / guidelines;   and documenting the encounter.  Cephus Slater  participated in the discussions, expressed understanding, and voiced agreement with the above plans.  All questions were answered to her satisfaction. she is encouraged to contact clinic should she have any questions or concerns  prior to her return visit.  - Return in about 6 months (around 03/16/2022), or Request for a copy of her DXA scan from Dr. Sherrie Sport Office, for F/U with Pre-visit Labs.   Glade Lloyd, MD [facility] [address] [place], [place] [address] Phone: [phone]  Fax: [phone]    This note was partially dictated with voice recognition software. Similar sounding words can be transcribed inadequately or may not  be corrected upon review.  09/16/2021, 4:09 PM

## 2021-10-17 DIAGNOSIS — S161XXA Strain of muscle, fascia and tendon at neck level, initial encounter: Secondary | ICD-10-CM | POA: Diagnosis not present

## 2021-10-17 DIAGNOSIS — Z885 Allergy status to narcotic agent status: Secondary | ICD-10-CM | POA: Diagnosis not present

## 2021-10-17 DIAGNOSIS — Z91041 Radiographic dye allergy status: Secondary | ICD-10-CM | POA: Diagnosis not present

## 2021-10-17 DIAGNOSIS — M542 Cervicalgia: Secondary | ICD-10-CM | POA: Diagnosis not present

## 2021-10-17 DIAGNOSIS — Z91013 Allergy to seafood: Secondary | ICD-10-CM | POA: Diagnosis not present

## 2021-10-17 DIAGNOSIS — I10 Essential (primary) hypertension: Secondary | ICD-10-CM | POA: Diagnosis not present

## 2021-10-17 DIAGNOSIS — Z91010 Allergy to peanuts: Secondary | ICD-10-CM | POA: Diagnosis not present

## 2021-10-17 DIAGNOSIS — Z880 Allergy status to penicillin: Secondary | ICD-10-CM | POA: Diagnosis not present

## 2021-10-17 DIAGNOSIS — E785 Hyperlipidemia, unspecified: Secondary | ICD-10-CM | POA: Diagnosis not present

## 2021-10-17 DIAGNOSIS — M2578 Osteophyte, vertebrae: Secondary | ICD-10-CM | POA: Diagnosis not present

## 2021-10-17 DIAGNOSIS — Z888 Allergy status to other drugs, medicaments and biological substances status: Secondary | ICD-10-CM | POA: Diagnosis not present

## 2021-10-17 DIAGNOSIS — X58XXXA Exposure to other specified factors, initial encounter: Secondary | ICD-10-CM | POA: Diagnosis not present

## 2021-10-28 DIAGNOSIS — B351 Tinea unguium: Secondary | ICD-10-CM | POA: Diagnosis not present

## 2021-10-28 DIAGNOSIS — M542 Cervicalgia: Secondary | ICD-10-CM | POA: Diagnosis not present

## 2021-10-28 DIAGNOSIS — M79674 Pain in right toe(s): Secondary | ICD-10-CM | POA: Diagnosis not present

## 2021-10-28 DIAGNOSIS — M79675 Pain in left toe(s): Secondary | ICD-10-CM | POA: Diagnosis not present

## 2021-11-07 ENCOUNTER — Ambulatory Visit (INDEPENDENT_AMBULATORY_CARE_PROVIDER_SITE_OTHER): Payer: Medicare Other | Admitting: Urology

## 2021-11-07 ENCOUNTER — Encounter: Payer: Self-pay | Admitting: Urology

## 2021-11-07 VITALS — BP 177/62 | HR 56 | Ht <= 58 in | Wt 140.0 lb

## 2021-11-07 DIAGNOSIS — R8290 Unspecified abnormal findings in urine: Secondary | ICD-10-CM

## 2021-11-07 DIAGNOSIS — Z87440 Personal history of urinary (tract) infections: Secondary | ICD-10-CM | POA: Insufficient documentation

## 2021-11-07 DIAGNOSIS — N3941 Urge incontinence: Secondary | ICD-10-CM | POA: Diagnosis not present

## 2021-11-07 LAB — URINALYSIS, ROUTINE W REFLEX MICROSCOPIC
Bilirubin, UA: NEGATIVE
Glucose, UA: NEGATIVE
Ketones, UA: NEGATIVE
Nitrite, UA: NEGATIVE
Protein,UA: NEGATIVE
Specific Gravity, UA: 1.02 (ref 1.005–1.030)
Urobilinogen, Ur: 0.2 mg/dL (ref 0.2–1.0)
pH, UA: 5.5 (ref 5.0–7.5)

## 2021-11-07 LAB — MICROSCOPIC EXAMINATION
Epithelial Cells (non renal): NONE SEEN /hpf (ref 0–10)
Renal Epithel, UA: NONE SEEN /hpf

## 2021-11-07 LAB — BLADDER SCAN AMB NON-IMAGING: Scan Result: 21

## 2021-11-07 MED ORDER — MIRABEGRON ER 50 MG PO TB24: 50.0000 mg | ORAL_TABLET | Freq: Every day | ORAL | 11 refills | Status: DC

## 2021-11-07 NOTE — Progress Notes (Signed)
? ?Assessment: ?1. Urge incontinence   ?2. History of UTI   ?3. Abnormal urine findings   ? ? ?Plan: ?Urine culture sent today. ?Request urine culture results from Dr. Sherrie Sport and Nanine Means ?Trial of Myrbetriq 50 mg daily. ?Return to office in 1 month. ? ?Chief Complaint:  ?Chief Complaint  ?Patient presents with  ? Urinary Incontinence  ? ? ?History of Present Illness: ? ?Janice [name] is a 88 y.o. year old female who is seen in consultation from Neale Burly, MD for evaluation of urinary incontinence and recent UTIs.  She has a several year history of urinary incontinence.  She has urinary frequency, urgency, and urge incontinence.  She is using 6 adult pull-ups per day.  She has recently been treated for 2 UTIs the last being approximately 2 weeks ago.  No urine culture results available.  She is not currently on any antibiotics.  She is not having any dysuria or gross hematuria.  She does report a prior history of frequent UTIs.  She has previously seen urology in [place] approximately 1 year ago.  No records available. ?She has not tried any medical therapy for her incontinence. ?She does not have any problems with constipation or fecal incontinence. ? ? ?Past Medical History:  ?Past Medical History:  ?Diagnosis Date  ? Age-related osteoporosis without current pathological fracture   ? Essential hypertension   ? Hypercalcemia   ? Low back pain   ? Mixed hyperlipidemia   ? ? ?Past Surgical History:  ?Past Surgical History:  ?Procedure Laterality Date  ? COLONOSCOPY    ? GALLBLADDER SURGERY    ? December 2021  ? ? ?Allergies:  ?Allergies  ?Allergen Reactions  ? Iodinated Contrast Media Swelling  ?  Other reaction(s): Hypotension, Other (see comments) ?Other reaction(s): Hypotension ?Other reaction(s): Hypotension ?  ? Iodine Swelling  ?  Other reaction(s): Abdominal Pain, Other (see comments) ?Other reaction(s): Abdominal Pain ?Other reaction(s): Abdominal Pain ?  ? Nifedipine Swelling   ?  Other reaction(s): Other (see comments) ?Blood vessels swelled large in legs ?Blood vessels swelled large in legs ?Blood vessels swelled large in legs ?Blood vessels swelled large in legs ?  ? Penicillins Hives and Rash  ? Primidone   ?  Other reaction(s): Hypertension, Other (see comments) ?Stroke type symptoms, slurred speech, weakness ?Other reaction(s): Hypertension ?Stroke type symptoms, slurred speech, weakness ?Stroke type symptoms, slurred speech, weakness ?Other reaction(s): Hypertension ?Stroke type symptoms, slurred speech, weakness ?  ? Codeine Nausea And Vomiting  ? Erythromycin Nausea And Vomiting and Rash  ? Isosorbide   ?  Other reaction(s): Other (see comments) ?Not sure ?Not sure ?Not sure ?Not sure ?  ? Peanut Oil   ?  Other reaction(s): Other (see comments) ?Stomach cramps ?Stomach cramps ?Stomach cramps ?Stomach cramps ?  ? Fish Oil   ? Imdur [Isosorbide Nitrate]   ? ? ?Family History:  ?Family History  ?Problem Relation Age of Onset  ? AAA (abdominal aortic aneurysm) Mother   ? CAD Father   ? ? ?Social History:  ?Social History  ? ?Tobacco Use  ? Smoking status: Never  ?Vaping Use  ? Vaping Use: Never used  ?Substance Use Topics  ? Alcohol use: Never  ? Drug use: Never  ? ? ?Review of symptoms:  ?Constitutional:  Negative for unexplained weight loss, night sweats, fever, chills ?ENT:  Negative for nose bleeds, sinus pain, painful swallowing ?CV:  Negative for chest pain, shortness of breath, exercise intolerance, palpitations,  loss of consciousness ?Resp:  Negative for cough, wheezing, shortness of breath ?GI:  Negative for nausea, vomiting, diarrhea, bloody stools ?GU:  Positives noted in HPI; otherwise negative for gross hematuria, dysuria ?Neuro:  Negative for seizures, poor balance, limb weakness, slurred speech ?Psych:  Negative for lack of energy, depression, anxiety ?Endocrine:  Negative for polydipsia, polyuria, symptoms of hypoglycemia (dizziness, hunger, sweating) ?Hematologic:   Negative for anemia, purpura, petechia, prolonged or excessive bleeding, use of anticoagulants  ?Allergic:  Negative for difficulty breathing or choking as a result of exposure to anything; no shellfish allergy; no allergic response (rash/itch) to materials, foods ? ?Physical exam: ?BP (!) 177/62   Pulse (!) 56   Ht '4\' 9"'$  (1.448 m)   Wt 140 lb (63.5 kg)   BMI 30.30 kg/m?  ?GENERAL APPEARANCE:  Well appearing, well developed, well nourished, NAD ?HEENT: Atraumatic, Normocephalic, oropharynx clear. ?NECK: Supple without lymphadenopathy or thyromegaly. ?LUNGS: Clear to auscultation bilaterally. ?HEART: Regular Rate and Rhythm without murmurs, gallops, or rubs. ?ABDOMEN: Soft, non-tender, No Masses. ?EXTREMITIES: Moves all extremities well.  Without clubbing, cyanosis, or edema. ?NEUROLOGIC:  Alert and oriented x 3, CN II-XII grossly intact.  ?MENTAL STATUS:  Appropriate. ?BACK:  Non-tender to palpation.  No CVAT ?SKIN:  Warm, dry and intact.   ? ?Results: ?U/A: 6-10 WBCs, 0-2 RBCs, moderate bacteria ? ?Results for orders placed or performed in visit on 11/07/21 (from the past 24 hour(s))  ?BLADDER SCAN AMB NON-IMAGING  ? Collection Time: 11/07/21 10:30 AM  ?Result Value Ref Range  ? Scan Result 21   ? ? ?

## 2021-11-07 NOTE — Progress Notes (Signed)
post void residual=21 °

## 2021-11-10 ENCOUNTER — Other Ambulatory Visit: Payer: Self-pay

## 2021-11-10 LAB — URINE CULTURE

## 2021-11-11 MED ORDER — FOSFOMYCIN TROMETHAMINE 3 G PO PACK: PACK | ORAL | 9 refills | Status: DC

## 2021-11-11 MED ORDER — CIPROFLOXACIN HCL 500 MG PO TABS: 500.0000 mg | ORAL_TABLET | Freq: Two times a day (BID) | ORAL | 0 refills | Status: AC

## 2021-11-11 NOTE — Addendum Note (Signed)
Addended by: Primus Bravo on: 11/11/2021 10:28 AM ? ? Modules accepted: Orders ? ?

## 2021-11-11 NOTE — Progress Notes (Signed)
Sharyn Lull, resident care coordinator made aware.  ?

## 2021-11-16 DIAGNOSIS — I10 Essential (primary) hypertension: Secondary | ICD-10-CM | POA: Diagnosis not present

## 2021-11-16 DIAGNOSIS — E7849 Other hyperlipidemia: Secondary | ICD-10-CM | POA: Diagnosis not present

## 2021-11-19 ENCOUNTER — Other Ambulatory Visit: Payer: Self-pay | Admitting: "Endocrinology

## 2021-11-19 DIAGNOSIS — E210 Primary hyperparathyroidism: Secondary | ICD-10-CM

## 2021-12-05 ENCOUNTER — Emergency Department (HOSPITAL_COMMUNITY): Payer: Medicare Other

## 2021-12-05 ENCOUNTER — Other Ambulatory Visit: Payer: Self-pay

## 2021-12-05 ENCOUNTER — Encounter (HOSPITAL_COMMUNITY): Payer: Self-pay

## 2021-12-05 ENCOUNTER — Emergency Department (HOSPITAL_COMMUNITY)
Admission: EM | Admit: 2021-12-05 | Discharge: 2021-12-05 | Disposition: A | Discharge status: Home / Self Care | Payer: Medicare Other | Attending: Emergency Medicine | Admitting: Emergency Medicine

## 2021-12-05 DIAGNOSIS — M549 Dorsalgia, unspecified: Secondary | ICD-10-CM | POA: Diagnosis not present

## 2021-12-05 DIAGNOSIS — R52 Pain, unspecified: Secondary | ICD-10-CM | POA: Diagnosis not present

## 2021-12-05 DIAGNOSIS — Z91010 Allergy to peanuts: Secondary | ICD-10-CM | POA: Diagnosis not present

## 2021-12-05 DIAGNOSIS — W19XXXA Unspecified fall, initial encounter: Secondary | ICD-10-CM | POA: Diagnosis not present

## 2021-12-05 DIAGNOSIS — Z79899 Other long term (current) drug therapy: Secondary | ICD-10-CM | POA: Diagnosis not present

## 2021-12-05 DIAGNOSIS — S0081XA Abrasion of other part of head, initial encounter: Secondary | ICD-10-CM | POA: Insufficient documentation

## 2021-12-05 DIAGNOSIS — W01198A Fall on same level from slipping, tripping and stumbling with subsequent striking against other object, initial encounter: Secondary | ICD-10-CM | POA: Diagnosis not present

## 2021-12-05 DIAGNOSIS — M50221 Other cervical disc displacement at C4-C5 level: Secondary | ICD-10-CM | POA: Diagnosis not present

## 2021-12-05 DIAGNOSIS — S0990XA Unspecified injury of head, initial encounter: Secondary | ICD-10-CM | POA: Diagnosis not present

## 2021-12-05 DIAGNOSIS — I10 Essential (primary) hypertension: Secondary | ICD-10-CM | POA: Insufficient documentation

## 2021-12-05 DIAGNOSIS — M542 Cervicalgia: Secondary | ICD-10-CM

## 2021-12-05 DIAGNOSIS — Z043 Encounter for examination and observation following other accident: Secondary | ICD-10-CM | POA: Diagnosis not present

## 2021-12-05 DIAGNOSIS — M545 Low back pain, unspecified: Secondary | ICD-10-CM

## 2021-12-05 DIAGNOSIS — M48061 Spinal stenosis, lumbar region without neurogenic claudication: Secondary | ICD-10-CM | POA: Diagnosis not present

## 2021-12-05 NOTE — ED Provider Notes (Addendum)
[facility] EMERGENCY DEPARTMENT Provider Note   CSN: [id number] Arrival date & time: 12/05/21  1732     History  Chief Complaint  Patient presents with   [facility] Janice [name] is a 88 y.o. female.  Patient brought in from [place] after a fall using her rollator.  Patient fell back hit her back on the wall and then fell forward and face planted.  Knocked a lens out of her glasses.  Patient with mild abrasion to the forehead.  Patient also complained of some neck pain mostly on the left lateral.  And low back pain midline.  Denies any radiation of pain into the hips or lower extremities.  Able to raise both lower extremities without any discomfort or difficulty.  Patient denies loss of consciousness.  Patient not on blood thinners.  Past medical history significant for hypertension hyperlipidemia low back pain.  Past surgery gallbladder surgery assume gallbladder removal December 2021.        Home Medications Prior to Admission medications   Medication Sig Start Date End Date Taking? Authorizing Provider  alendronate (FOSAMAX) 70 MG tablet Take 70 mg by mouth once a week. 05/28/20  Yes [provider]  atenolol (TENORMIN) 50 MG tablet Take 50 mg by mouth daily. 03/18/20  Yes [provider]  chlordiazePOXIDE (LIBRIUM) 10 MG capsule Take 10 mg by mouth daily as needed for anxiety.   Yes [provider]  Cholecalciferol (VITAMIN D-3) 25 MCG (1000 UT) CAPS Take 1 capsule by mouth daily.   Yes [provider]  cinacalcet (SENSIPAR) 30 MG tablet Take 1 tablet (30 mg total) by mouth 2 (two) times daily with a meal. 11/20/21  Yes Nida, Marella Chimes, MD  CRANBERRY CONCENTRATE PO Take 1 tablet by mouth daily.   Yes [provider]  DICLOFENAC PO Take 1 tablet by mouth 2 (two) times daily.   Yes [provider]  DULoxetine (CYMBALTA) 20 MG capsule Take 20 mg by mouth daily as needed.   Yes [provider]   fenofibrate 54 MG tablet Take 1 tablet by mouth daily. 04/23/20  Yes [provider]  fluticasone (FLONASE) 50 MCG/ACT nasal spray Place 1 spray into both nostrils daily.   Yes [provider]  fosfomycin (MONUROL) 3 g PACK Take 3 gm by mouth every 10 days for UTI prevention 11/11/21  Yes Stoneking, Reece Leader., MD  furosemide (LASIX) 20 MG tablet Take 20 mg by mouth daily. 03/12/20  Yes [provider]  losartan (COZAAR) 100 MG tablet Take 100 mg by mouth daily. 06/03/20  Yes [provider]  mirabegron ER (MYRBETRIQ) 50 MG TB24 tablet Take 1 tablet (50 mg total) by mouth daily. 11/07/21  Yes Stoneking, Reece Leader., MD  Multiple Vitamins-Minerals (OCUVITE PO) Take by mouth.   Yes [provider]  olmesartan (BENICAR) 40 MG tablet Take 40 mg by mouth daily. 05/25/21  Yes [provider]  pantoprazole (PROTONIX) 40 MG tablet Take 40 mg by mouth daily. 04/25/20  Yes [provider]  pramipexole (MIRAPEX) 0.25 MG tablet Take 1 tablet by mouth every 12 (twelve) hours. 08/15/13  Yes [provider]  RABEprazole (ACIPHEX) 20 MG tablet Take 20 mg by mouth daily.   Yes [provider]  Red Yeast Rice Extract (RED YEAST RICE PO) Take 1 tablet by mouth daily.   Yes [provider]  traMADol (ULTRAM) 50 MG tablet Take 50 mg by mouth 2 (two) times daily.  Yes [provider]  vitamin B-12 (CYANOCOBALAMIN) 1000 MCG tablet Take 1,000 mcg by mouth daily.   Yes [provider]      Allergies    Iodinated contrast media, Iodine, Nifedipine, Penicillins, Primidone, Codeine, Erythromycin, Isosorbide, Peanut oil, Fish oil, and Imdur [isosorbide nitrate]    Review of Systems   Review of Systems  Constitutional:  Negative for chills and fever.  HENT:  Negative for ear pain and sore throat.   Eyes:  Negative for pain and visual disturbance.  Respiratory:  Negative for cough and shortness of breath.    Cardiovascular:  Negative for chest pain and palpitations.  Gastrointestinal:  Negative for abdominal pain and vomiting.  Genitourinary:  Negative for dysuria and hematuria.  Musculoskeletal:  Positive for back pain and neck pain. Negative for arthralgias.  Skin:  Negative for color change and rash.  Neurological:  Positive for tremors. Negative for dizziness, seizures, syncope, facial asymmetry, speech difficulty, weakness, numbness and headaches.  All other systems reviewed and are negative.  Physical Exam Updated Vital Signs BP (!) 168/62   Pulse 72   Temp 98 F (36.7 C) (Oral)   Resp (!) 23   Ht 1.422 m ('4\' 8"'$ )   Wt 63.5 kg   SpO2 96%   BMI 31.39 kg/m  Physical Exam Vitals and nursing note reviewed.  Constitutional:      General: She is not in acute distress.    Appearance: Normal appearance. She is well-developed.  HENT:     Head: Normocephalic.     Comments: Patient with some erythema to the right forehead area. Eyes:     Extraocular Movements: Extraocular movements intact.     Conjunctiva/sclera: Conjunctivae normal.     Pupils: Pupils are equal, round, and reactive to light.  Neck:     Comments: Mild tenderness to palpation to the lateral part of the neck.  On the left side. Cardiovascular:     Rate and Rhythm: Normal rate and regular rhythm.     Heart sounds: No murmur heard. Pulmonary:     Effort: Pulmonary effort is normal. No respiratory distress.     Breath sounds: Normal breath sounds.  Abdominal:     Palpations: Abdomen is soft.     Tenderness: There is no abdominal tenderness.  Musculoskeletal:        General: Tenderness present. No swelling or deformity.     Cervical back: Neck supple. Tenderness present.     Right lower leg: No edema.     Left lower leg: No edema.     Comments: Tenderness to palpation to mid lumbar back area.  Patient able to raise both lower extremities without any difficulty or pain.  Upper extremities without evidence of any  injury good range of motion at the wrist elbow shoulders and fingers.  Skin:    General: Skin is warm and dry.     Capillary Refill: Capillary refill takes less than 2 seconds.  Neurological:     General: No focal deficit present.     Mental Status: She is alert and oriented to person, place, and time.     Cranial Nerves: No cranial nerve deficit.     Sensory: No sensory deficit.     Motor: No weakness.  Psychiatric:        Mood and Affect: Mood normal.    ED Results / Procedures / Treatments   Labs (all labs ordered are listed, but only abnormal results are displayed) Labs Reviewed - No  data to display  EKG None  Radiology CT Head Wo Contrast  Result Date: 12/05/2021 CLINICAL DATA:  Fall EXAM: CT HEAD WITHOUT CONTRAST CT CERVICAL SPINE WITHOUT CONTRAST TECHNIQUE: Multidetector CT imaging of the head and cervical spine was performed following the standard protocol without intravenous contrast. Multiplanar CT image reconstructions of the cervical spine were also generated. RADIATION DOSE REDUCTION: This exam was performed according to the departmental dose-optimization program which includes automated exposure control, adjustment of the mA and/or kV according to patient size and/or use of iterative reconstruction technique. COMPARISON:  05/02/2021 CT head, no prior cervical spine CT, correlation is made with cervical spine radiographs 10/17/2021 FINDINGS: CT HEAD FINDINGS Brain: No evidence of acute infarction, hemorrhage, cerebral edema, mass, mass effect, or midline shift. No hydrocephalus or extra-axial fluid collection. Periventricular white matter changes, likely the sequela of chronic small vessel ischemic disease. Vascular: No hyperdense vessel. Atherosclerotic calcifications in the intracranial carotid and vertebral arteries. Skull: Normal. Negative for fracture or focal lesion. Sinuses/Orbits: No acute finding. Status post bilateral lens replacements. Other: The mastoid air cells are  well aerated. CT CERVICAL SPINE FINDINGS Alignment: Reversal of the normal cervical lordosis. 3 mm anterolisthesis of C3 on C4, which appears degenerative. No jumped facets. Skull base and vertebrae: No acute fracture or suspicious osseous lesion. Soft tissues and spinal canal: No prevertebral fluid or swelling. No visible canal hematoma. Disc levels: Large calcified disc protrusion at C3-C4 and calcified disc bulge at C4-C5, which causes mild spinal canal stenosis at these levels. Multilevel uncovertebral and facet arthropathy, which causes up to severe neural foraminal narrowing, which is noted on the left at C3-C4 and C4-C5 and bilaterally at C5-C6 Upper chest: . no focal pulmonary opacity or pleural effusion. Other: None. IMPRESSION: 1.  No acute intracranial process. 2.  No acute fracture or traumatic listhesis in the cervical spine. Electronically Signed   By: Merilyn Baba M.D.   On: 12/05/2021 19:06   CT Cervical Spine Wo Contrast  Result Date: 12/05/2021 CLINICAL DATA:  Fall EXAM: CT HEAD WITHOUT CONTRAST CT CERVICAL SPINE WITHOUT CONTRAST TECHNIQUE: Multidetector CT imaging of the head and cervical spine was performed following the standard protocol without intravenous contrast. Multiplanar CT image reconstructions of the cervical spine were also generated. RADIATION DOSE REDUCTION: This exam was performed according to the departmental dose-optimization program which includes automated exposure control, adjustment of the mA and/or kV according to patient size and/or use of iterative reconstruction technique. COMPARISON:  05/02/2021 CT head, no prior cervical spine CT, correlation is made with cervical spine radiographs 10/17/2021 FINDINGS: CT HEAD FINDINGS Brain: No evidence of acute infarction, hemorrhage, cerebral edema, mass, mass effect, or midline shift. No hydrocephalus or extra-axial fluid collection. Periventricular white matter changes, likely the sequela of chronic small vessel ischemic  disease. Vascular: No hyperdense vessel. Atherosclerotic calcifications in the intracranial carotid and vertebral arteries. Skull: Normal. Negative for fracture or focal lesion. Sinuses/Orbits: No acute finding. Status post bilateral lens replacements. Other: The mastoid air cells are well aerated. CT CERVICAL SPINE FINDINGS Alignment: Reversal of the normal cervical lordosis. 3 mm anterolisthesis of C3 on C4, which appears degenerative. No jumped facets. Skull base and vertebrae: No acute fracture or suspicious osseous lesion. Soft tissues and spinal canal: No prevertebral fluid or swelling. No visible canal hematoma. Disc levels: Large calcified disc protrusion at C3-C4 and calcified disc bulge at C4-C5, which causes mild spinal canal stenosis at these levels. Multilevel uncovertebral and facet arthropathy, which causes up to severe neural  foraminal narrowing, which is noted on the left at C3-C4 and C4-C5 and bilaterally at C5-C6 Upper chest: . no focal pulmonary opacity or pleural effusion. Other: None. IMPRESSION: 1.  No acute intracranial process. 2.  No acute fracture or traumatic listhesis in the cervical spine. Electronically Signed   By: Merilyn Baba M.D.   On: 12/05/2021 19:06   CT Lumbar Spine Wo Contrast  Result Date: 12/05/2021 CLINICAL DATA:  Fall EXAM: CT LUMBAR SPINE WITHOUT CONTRAST TECHNIQUE: Multidetector CT imaging of the lumbar spine was performed without intravenous contrast administration. Multiplanar CT image reconstructions were also generated. RADIATION DOSE REDUCTION: This exam was performed according to the departmental dose-optimization program which includes automated exposure control, adjustment of the mA and/or kV according to patient size and/or use of iterative reconstruction technique. COMPARISON:  None Available. FINDINGS: Segmentation: 5 lumbar type vertebrae. Alignment: Severe levoscoliosis with apex at L2 Vertebrae: No acute fracture Paraspinal and other soft tissues:  Calcific aortic atherosclerosis Disc levels: There is at least moderate spinal canal stenosis at L3-4. Otherwise, no high-grade spinal canal stenosis. There is moderate right neural foraminal stenosis at L1-2 and moderate left foraminal stenosis at L4-5 and L5-S1. IMPRESSION: 1. No acute fracture of the lumbar spine. 2. Severe levoscoliosis with apex at L2. 3. At least moderate spinal canal stenosis at L3-4. 4. Moderate right neural foraminal stenosis at L1-2, moderate left foraminal stenosis at L4-5 and L5-S1. 5. Aortic Atherosclerosis (ICD10-I70.0). Electronically Signed   By: Ulyses Jarred M.D.   On: 12/05/2021 19:08    Procedures Procedures    Medications Ordered in ED Medications - No data to display  ED Course/ Medical Decision Making/ A&P                           Medical Decision Making Amount and/or Complexity of Data Reviewed Radiology: ordered.  Patient with a fall.  [place] backwards hit her back on the wall.  Then fell forward with a face plant knocked the lens out of her glasses on the right side.  Patient also with some erythema superficial abrasion to the right forehead area.  Some tenderness to the left lateral neck.  Tenderness to the low back.  No clinical concerns for any hip injury.  Patient with good range of motion of both lower extremities without any pain.  No neurovascular deficits.  Good cap refill to all extremities.  Patient not on blood thinners.  We will get CT head CT neck and CT lumbar back.  CT head is because patient did do a face plant.  To make sure no intracranial injury.  And CT neck because patient has some mild left lateral neck pain.  And patient definitely has tenderness to palpation to the lumbar part of the spine is the reason for CT lumbar spine.  CT head neck without any acute findings.  CT of the lumbar spine without any acute findings.  Patient can be treated symptomatically.  Patient stable to return back to nursing facility  Final Clinical  Impression(s) / ED Diagnoses Final diagnoses:  Fall, initial encounter  Injury of head, initial encounter  Acute midline low back pain without sciatica  Neck pain    Rx / DC Orders ED Discharge Orders     None         Fredia Sorrow, MD 12/05/21 1816    Fredia Sorrow, MD 12/05/21 2039

## 2021-12-05 NOTE — ED Triage Notes (Signed)
Pt bib ems from [facility] after a fall.  Denies hitting head.  Reports pain to lower back.  Denies loc.  Pt states that she lost balance.  Pt normally uses a Rolator.

## 2021-12-05 NOTE — Discharge Instructions (Signed)
CT head and neck without any acute findings.  CT scan of the lumbar back without any acute findings.  Patient with a lumbar strain.  Symptomatic treatment to follow-up with her doctors as needed.  Patient stable to return to [facility].

## 2021-12-10 ENCOUNTER — Ambulatory Visit (INDEPENDENT_AMBULATORY_CARE_PROVIDER_SITE_OTHER): Payer: Medicare Other | Admitting: Urology

## 2021-12-10 ENCOUNTER — Encounter: Payer: Self-pay | Admitting: Urology

## 2021-12-10 VITALS — BP 185/82 | HR 67 | Ht <= 58 in | Wt 142.6 lb

## 2021-12-10 DIAGNOSIS — N3941 Urge incontinence: Secondary | ICD-10-CM | POA: Diagnosis not present

## 2021-12-10 DIAGNOSIS — Z87440 Personal history of urinary (tract) infections: Secondary | ICD-10-CM

## 2021-12-10 LAB — BLADDER SCAN AMB NON-IMAGING: Scan Result: 2

## 2021-12-10 NOTE — Progress Notes (Signed)
Assessment: 1. Urge incontinence   2. History of UTI     Plan: Continue Myrbetriq 50 mg daily. Continue fosfomycin 3 gm PO every 10 days for UTI prevention Return to office in 2 months  Chief Complaint:  Chief Complaint  Patient presents with   Urinary Incontinence    History of Present Illness:  Janice [name] is a 88 y.o. year old female who is seen for further evaluation of urinary incontinence and recent UTIs.  She has a several year history of urinary incontinence.  Associated symptoms include urinary frequency, urgency, and urge incontinence.  She was using 6 adult pull-ups per day.  She was recently treated for 2 UTIs, the last in 4/23.  No urine culture results available.   No dysuria or gross hematuria.  She has a history of frequent UTIs.  She was previously seen urology in [place] approximately 1 year ago.  No records available. She had not tried any medical therapy for her incontinence. She does not have any problems with constipation or fecal incontinence.  Urine culture results: 10/22 E. Coli 1/23 E. coli  Urine culture from 11/07/2021 grew >100 K Klebsiella.  She was treated with Cipro x5 days and started on fosfomycin 3 g p.o. every 10 days for UTI prevention. She was given a trial of Myrbetriq 50 mg daily at her visit in 4/23.   She returns today for follow-up.  She continues on Myrbetriq 50 mg daily.  She has seen improvement in her incontinence.  She has not had any continence episodes recently.  She continues to have some urgency but is able to make it to the bathroom before any leakage of urine.  No side effects from the medication.  She continues on fosfomycin 3 g every 10 days.  She initially noted some diarrhea after taking the medication but this has improved.  Her last dose of fosfomycin was on 12/07/2021.  No dysuria or gross hematuria today.  Portions of the above documentation were copied from a prior visit for review purposes  only.   Past Medical History:  Past Medical History:  Diagnosis Date   Age-related osteoporosis without current pathological fracture    Essential hypertension    Hypercalcemia    Low back pain    Mixed hyperlipidemia     Past Surgical History:  Past Surgical History:  Procedure Laterality Date   COLONOSCOPY     GALLBLADDER SURGERY     December 2021    Allergies:  Allergies  Allergen Reactions   Iodinated Contrast Media Swelling    Other reaction(s): Hypotension, Other (see comments) Other reaction(s): Hypotension Other reaction(s): Hypotension    Iodine Swelling    Other reaction(s): Abdominal Pain, Other (see comments) Other reaction(s): Abdominal Pain Other reaction(s): Abdominal Pain    Nifedipine Swelling    Other reaction(s): Other (see comments) Blood vessels swelled large in legs Blood vessels swelled large in legs Blood vessels swelled large in legs Blood vessels swelled large in legs    Penicillins Hives and Rash   Primidone     Other reaction(s): Hypertension, Other (see comments) Stroke type symptoms, slurred speech, weakness Other reaction(s): Hypertension Stroke type symptoms, slurred speech, weakness Stroke type symptoms, slurred speech, weakness Other reaction(s): Hypertension Stroke type symptoms, slurred speech, weakness    Codeine Nausea And Vomiting   Erythromycin Nausea And Vomiting and Rash   Isosorbide     Other reaction(s): Other (see comments) Not sure Not sure Not sure Not sure  Peanut Oil     Other reaction(s): Other (see comments) Stomach cramps Stomach cramps Stomach cramps Stomach cramps    Fish Oil    Imdur [Isosorbide Nitrate]     Family History:  Family History  Problem Relation Age of Onset   AAA (abdominal aortic aneurysm) Mother    CAD Father     Social History:  Social History   Tobacco Use   Smoking status: Never  Vaping Use   Vaping Use: Never used  Substance Use Topics   Alcohol use: Never    Drug use: Never    ROS: Constitutional:  Negative for fever, chills, weight loss CV: Negative for chest pain, previous MI, hypertension Respiratory:  Negative for shortness of breath, wheezing, sleep apnea, frequent cough GI:  Negative for nausea, vomiting, bloody stool, GERD  Physical exam: BP (!) 185/82   Pulse 67   Ht '4\' 8"'$  (1.422 m)   Wt 142 lb 9.6 oz (64.7 kg)   BMI 31.97 kg/m  GENERAL APPEARANCE:  Well appearing, well developed, well nourished, NAD HEENT:  Atraumatic, normocephalic, oropharynx clear NECK:  Supple without lymphadenopathy or thyromegaly ABDOMEN:  Soft, non-tender, no masses EXTREMITIES:  Moves all extremities well, without clubbing, cyanosis, or edema NEUROLOGIC:  Alert and oriented x 3, uses walker,  CN II-XII grossly intact MENTAL STATUS:  appropriate BACK:  Non-tender to palpation, No CVAT SKIN:  Warm, dry, and intact  Results: U/A: 6-10 WBCs, 0-2 RBCs, few bacteria  PVR = 2 ml

## 2021-12-10 NOTE — Progress Notes (Signed)
post void residual =2mL 

## 2021-12-11 ENCOUNTER — Ambulatory Visit: Payer: Medicare Other | Admitting: Urology

## 2021-12-11 DIAGNOSIS — N3941 Urge incontinence: Secondary | ICD-10-CM | POA: Diagnosis not present

## 2021-12-11 LAB — URINALYSIS, ROUTINE W REFLEX MICROSCOPIC
Bilirubin, UA: NEGATIVE
Glucose, UA: NEGATIVE
Ketones, UA: NEGATIVE
Nitrite, UA: NEGATIVE
Protein,UA: NEGATIVE
RBC, UA: NEGATIVE
Specific Gravity, UA: 1.015 (ref 1.005–1.030)
Urobilinogen, Ur: 0.2 mg/dL (ref 0.2–1.0)
pH, UA: 7 (ref 5.0–7.5)

## 2021-12-11 LAB — MICROSCOPIC EXAMINATION: Renal Epithel, UA: NONE SEEN /hpf

## 2021-12-31 ENCOUNTER — Encounter (HOSPITAL_COMMUNITY): Payer: Self-pay

## 2021-12-31 ENCOUNTER — Emergency Department (HOSPITAL_COMMUNITY): Payer: Medicare Other

## 2021-12-31 ENCOUNTER — Other Ambulatory Visit: Payer: Self-pay

## 2021-12-31 ENCOUNTER — Emergency Department (HOSPITAL_COMMUNITY)
Admission: EM | Admit: 2021-12-31 | Discharge: 2021-12-31 | Disposition: A | Discharge status: Home / Self Care | Payer: Medicare Other | Attending: Student | Admitting: Student

## 2021-12-31 DIAGNOSIS — R0789 Other chest pain: Secondary | ICD-10-CM | POA: Insufficient documentation

## 2021-12-31 DIAGNOSIS — I10 Essential (primary) hypertension: Secondary | ICD-10-CM | POA: Diagnosis not present

## 2021-12-31 DIAGNOSIS — M25551 Pain in right hip: Secondary | ICD-10-CM | POA: Diagnosis not present

## 2021-12-31 DIAGNOSIS — S3991XA Unspecified injury of abdomen, initial encounter: Secondary | ICD-10-CM | POA: Diagnosis not present

## 2021-12-31 DIAGNOSIS — Y9301 Activity, walking, marching and hiking: Secondary | ICD-10-CM | POA: Insufficient documentation

## 2021-12-31 DIAGNOSIS — Z91010 Allergy to peanuts: Secondary | ICD-10-CM | POA: Diagnosis not present

## 2021-12-31 DIAGNOSIS — Y92009 Unspecified place in unspecified non-institutional (private) residence as the place of occurrence of the external cause: Secondary | ICD-10-CM | POA: Insufficient documentation

## 2021-12-31 DIAGNOSIS — Z79899 Other long term (current) drug therapy: Secondary | ICD-10-CM | POA: Diagnosis not present

## 2021-12-31 DIAGNOSIS — Z043 Encounter for examination and observation following other accident: Secondary | ICD-10-CM | POA: Diagnosis not present

## 2021-12-31 DIAGNOSIS — M47812 Spondylosis without myelopathy or radiculopathy, cervical region: Secondary | ICD-10-CM | POA: Diagnosis not present

## 2021-12-31 DIAGNOSIS — S299XXA Unspecified injury of thorax, initial encounter: Secondary | ICD-10-CM | POA: Diagnosis not present

## 2021-12-31 DIAGNOSIS — I739 Peripheral vascular disease, unspecified: Secondary | ICD-10-CM | POA: Diagnosis not present

## 2021-12-31 DIAGNOSIS — W19XXXA Unspecified fall, initial encounter: Secondary | ICD-10-CM | POA: Insufficient documentation

## 2021-12-31 DIAGNOSIS — M25561 Pain in right knee: Secondary | ICD-10-CM | POA: Insufficient documentation

## 2021-12-31 DIAGNOSIS — S301XXA Contusion of abdominal wall, initial encounter: Secondary | ICD-10-CM | POA: Insufficient documentation

## 2021-12-31 DIAGNOSIS — R52 Pain, unspecified: Secondary | ICD-10-CM | POA: Diagnosis not present

## 2021-12-31 DIAGNOSIS — S3993XA Unspecified injury of pelvis, initial encounter: Secondary | ICD-10-CM | POA: Diagnosis not present

## 2021-12-31 DIAGNOSIS — R109 Unspecified abdominal pain: Secondary | ICD-10-CM | POA: Diagnosis not present

## 2021-12-31 DIAGNOSIS — R519 Headache, unspecified: Secondary | ICD-10-CM | POA: Diagnosis not present

## 2021-12-31 DIAGNOSIS — S199XXA Unspecified injury of neck, initial encounter: Secondary | ICD-10-CM | POA: Diagnosis not present

## 2021-12-31 DIAGNOSIS — M4802 Spinal stenosis, cervical region: Secondary | ICD-10-CM | POA: Diagnosis not present

## 2021-12-31 DIAGNOSIS — E041 Nontoxic single thyroid nodule: Secondary | ICD-10-CM | POA: Diagnosis not present

## 2021-12-31 DIAGNOSIS — S0990XA Unspecified injury of head, initial encounter: Secondary | ICD-10-CM | POA: Diagnosis not present

## 2021-12-31 NOTE — ED Triage Notes (Signed)
Patient brought in by EMS for a fall at [facility].  States that the patient doesn't remember what happened but staff heard a crash and found a broken table and a tv on the patient.  Patient has complaints of pain to the head on the right side, right side hip pain, and right knee. Patient is hypertensive, but does have a hx.  Patient is conscious and alert at triage.

## 2021-12-31 NOTE — ED Provider Notes (Signed)
[facility] EMERGENCY DEPARTMENT Provider Note  CSN: [id number] Arrival date & time: 12/31/21 1127  Chief Complaint(s) Fall  HPI Janice [name] is a 88 y.o. female with PMH HTN, HLD, osteoporosis, frequent falls currently residing at [facility] who presents emergency department for evaluation of a fall.  Patient states that she remembers walking in her room, falling onto a table and onto the floor.  She denies loss of consciousness.  She states that staff found her on the ground with the TV on top of her and arrived with complaints of right-sided headache, right lower quadrant abdominal pain, right hip pain and right knee pain as well as right chest wall pain.  She denies shortness of breath, nausea, vomiting, numbness, tingling, weakness or other systemic or traumatic complaints.  No blood thinner use.   Past Medical History Past Medical History:  Diagnosis Date   Age-related osteoporosis without current pathological fracture    Essential hypertension    Hypercalcemia    Low back pain    Mixed hyperlipidemia    Patient Active Problem List   Diagnosis Date Noted   History of UTI 11/07/2021   Urge incontinence 11/07/2021   Age-related osteoporosis without current pathological fracture 09/11/2020   Hyperparathyroidism, primary ([place]) 06/11/2020   Hypercalcemia 05/30/2020   Home Medication(s) Prior to Admission medications   Medication Sig Start Date End Date Taking? Authorizing Provider  alendronate (FOSAMAX) 70 MG tablet Take 70 mg by mouth once a week. 05/28/20   [provider]  atenolol (TENORMIN) 50 MG tablet Take 50 mg by mouth daily. 03/18/20   [provider]  chlordiazePOXIDE (LIBRIUM) 10 MG capsule Take 10 mg by mouth daily as needed for anxiety. Patient not taking: Reported on 12/10/2021    [provider]  Cholecalciferol (VITAMIN D-3) 25 MCG (1000 UT) CAPS Take 1 capsule by mouth daily.    [provider]  cinacalcet (SENSIPAR)  30 MG tablet Take 1 tablet (30 mg total) by mouth 2 (two) times daily with a meal. 11/20/21   Nida, Marella Chimes, MD  CRANBERRY CONCENTRATE PO Take 1 tablet by mouth daily.    [provider]  DICLOFENAC PO Take 1 tablet by mouth 2 (two) times daily.    [provider]  DULoxetine (CYMBALTA) 20 MG capsule Take 20 mg by mouth daily as needed. Patient not taking: Reported on 12/10/2021    [provider]  fenofibrate 54 MG tablet Take 1 tablet by mouth daily. 04/23/20   [provider]  fluticasone (FLONASE) 50 MCG/ACT nasal spray Place 1 spray into both nostrils daily.    [provider]  fosfomycin (MONUROL) 3 g PACK Take 3 gm by mouth every 10 days for UTI prevention 11/11/21   Stoneking, Reece Leader., MD  furosemide (LASIX) 20 MG tablet Take 20 mg by mouth daily. 03/12/20   [provider]  LORazepam (ATIVAN) 0.5 MG tablet Take 0.5 mg by mouth 3 (three) times daily as needed. 09/30/21   [provider]  losartan (COZAAR) 100 MG tablet Take 100 mg by mouth daily. 06/03/20   [provider]  mirabegron ER (MYRBETRIQ) 50 MG TB24 tablet Take 1 tablet (50 mg total) by mouth daily. 11/07/21   Stoneking, Reece Leader., MD  Multiple Vitamins-Minerals (OCUVITE PO) Take by mouth.    [provider]  olmesartan (BENICAR) 40 MG tablet Take 40 mg by mouth daily. 05/25/21   [provider]  pantoprazole (PROTONIX) 40 MG tablet Take 40 mg by  mouth daily. Patient not taking: Reported on 12/10/2021 04/25/20   [provider]  pramipexole (MIRAPEX) 0.25 MG tablet Take 1 tablet by mouth every 12 (twelve) hours. 08/15/13   [provider]  RABEprazole (ACIPHEX) 20 MG tablet Take 20 mg by mouth daily. Patient not taking: Reported on 12/10/2021    [provider]  Red Yeast Rice Extract (RED YEAST RICE PO) Take 1 tablet by mouth daily. Patient not taking: Reported on 12/10/2021    [provider]  traMADol  (ULTRAM) 50 MG tablet Take 50 mg by mouth 2 (two) times daily. Patient not taking: Reported on 12/10/2021    [provider]  vitamin B-12 (CYANOCOBALAMIN) 1000 MCG tablet Take 1,000 mcg by mouth daily.    [provider]                                                                                                                                    Past Surgical History Past Surgical History:  Procedure Laterality Date   COLONOSCOPY     GALLBLADDER SURGERY     December 2021   Family History Family History  Problem Relation Age of Onset   AAA (abdominal aortic aneurysm) Mother    CAD Father     Social History Social History   Tobacco Use   Smoking status: Never  Vaping Use   Vaping Use: Never used  Substance Use Topics   Alcohol use: Never   Drug use: Never   Allergies Iodinated contrast media, Iodine, Nifedipine, Penicillins, Primidone, Codeine, Erythromycin, Isosorbide, Peanut oil, Fish oil, and Imdur [isosorbide nitrate]  Review of Systems Review of Systems  Musculoskeletal:  Positive for arthralgias and myalgias.  Neurological:  Positive for headaches.    Physical Exam Vital Signs  I have reviewed the triage vital signs BP (!) 202/70 (BP Location: Right Arm)   Pulse 68   Temp 98.6 F (37 C) (Oral)   Resp 18   Ht '4\' 8"'$  (1.422 m)   Wt 63.5 kg   SpO2 99%   BMI 31.39 kg/m   Physical Exam Vitals and nursing note reviewed.  Constitutional:      General: She is not in acute distress.    Appearance: She is well-developed.  HENT:     Head: Normocephalic.     Comments: Left-sided scalp tenderness Eyes:     Conjunctiva/sclera: Conjunctivae normal.  Cardiovascular:     Rate and Rhythm: Normal rate and regular rhythm.     Heart sounds: No murmur heard. Pulmonary:     Effort: Pulmonary effort is normal. No respiratory distress.     Breath sounds: Normal breath sounds.  Abdominal:     Palpations: Abdomen is soft.     Tenderness: There is  abdominal tenderness.  Musculoskeletal:        General: Tenderness (Left hip, left knee, left chest wall) present. No swelling.     Cervical back:  Neck supple.  Skin:    General: Skin is warm and dry.     Capillary Refill: Capillary refill takes less than 2 seconds.  Neurological:     Mental Status: She is alert.  Psychiatric:        Mood and Affect: Mood normal.     ED Results and Treatments Labs (all labs ordered are listed, but only abnormal results are displayed) Labs Reviewed - No data to display                                                                                                                        Radiology CT CHEST ABDOMEN PELVIS WO CONTRAST  Result Date: 12/31/2021 CLINICAL DATA:  Polytrauma, blunt contrast allergy Fall today with right-sided chest and abdominal pain. EXAM: CT CHEST, ABDOMEN AND PELVIS WITHOUT CONTRAST TECHNIQUE: Multidetector CT imaging of the chest, abdomen and pelvis was performed following the standard protocol without IV contrast. RADIATION DOSE REDUCTION: This exam was performed according to the departmental dose-optimization program which includes automated exposure control, adjustment of the mA and/or kV according to patient size and/or use of iterative reconstruction technique. COMPARISON:  Chest and pelvis radiographs earlier today. Abdominopelvic CT 05/02/2021. Lumbar spine CT 12/05/2021 FINDINGS: CT CHEST FINDINGS Cardiovascular: Lack of IV contrast limits detailed assessment for vascular injury. There is no periaortic stranding. Normal caliber thoracic aorta with moderate atherosclerosis. The heart is mildly enlarged. No pericardial effusion. There are coronary artery calcifications. Mediastinum/Nodes: No mediastinal hematoma. No mediastinal adenopathy. Decompressed esophagus. 11 mm hypodense left thyroid nodule. Not clinically significant; no follow-up imaging recommended (ref: J Am Coll Radiol. 2015 Feb;12(2): 143-50). Lungs/Pleura: No  pneumothorax. No pulmonary contusion. 8 x 6 mm right lower lobe pulmonary nodule series 3, image 87, stable in size dating back to 06/30/2020 abdominal CT. Additional 4 mm right lower lobe nodule series 3, image 82 is also unchanged. No acute airspace disease. Chronic elevation of right hemidiaphragm. There is slight septal thickening at the lung apices. No pleural fluid. Musculoskeletal: No acute rib fracture. Remote fracture of posterior left tenth and eleventh ribs. The sternum and included shoulder girdles are intact. Chondrocalcinosis in bilateral glenohumeral osteoarthritis. Scoliosis and degenerative change in the thoracic spine. No acute fracture. No chest wall soft tissue contusion. CT ABDOMEN PELVIS FINDINGS Hepatobiliary: Evaluation for injury is limited in the absence of IV contrast. Stable cyst in the anterior left hepatic lobe. Homogeneous hepatic attenuation. No perihepatic hematoma. Clips in the gallbladder fossa postcholecystectomy. No biliary dilatation. Pancreas: No evidence of injury on this unenhanced exam. Mild fatty atrophy. No ductal dilatation or inflammation. Spleen: Assessment for injury is limited in the absence of IV contrast. Homogeneous attenuation. No perisplenic hematoma. Adrenals/Urinary Tract: Normal adrenal glands. No perinephric stranding to suggest injury. There are punctate bilateral nonobstructing renal calculi. Stable cortical hypodensity in the lateral left kidney, likely cyst, although too small to accurately characterize. No further follow-up is recommended. Distended but otherwise normal appearing urinary bladder. Stomach/Bowel: Stomach is decompressed.  There is no bowel obstruction or inflammation. Left colonic diverticulosis without diverticulitis. No evidence of bowel injury or mesenteric hematoma. Vascular/Lymphatic: Assessment for vascular injury is limited on this unenhanced exam. There is no periaortic or perivascular haziness. Aortic atherosclerosis and  tortuosity. No retroperitoneal fluid. No adenopathy. Reproductive: Status post hysterectomy. No adnexal masses. Other: No free air or ascites. Patchy subcutaneous contusion involving the right lateral abdominal wall. No large hematoma. Musculoskeletal: Scoliosis with prominent degenerative change in the lumbar spine. No acute lumbar fracture. Bilateral hip osteoarthritis. No pelvic or hip fracture. Intact pubic rami. IMPRESSION: 1. Patchy subcutaneous contusion involving the right lower lateral abdominal wall. No large confluent hematoma. 2. No additional acute traumatic injury to the chest, abdomen, or pelvis. 3. Incidental findings in the chest include right lower lobe pulmonary nodules that are stable dating back to 06/30/2020 abdominal CT. This exam constitutes 18 month imaging stability. Nodules are likely benign. 4. Incidental colonic diverticulosis without diverticulitis. Nonobstructing bilateral nephrolithiasis. Aortic Atherosclerosis (ICD10-I70.0). Electronically Signed   By: Keith Rake M.D.   On: 12/31/2021 15:23   CT HEAD WO CONTRAST (5MM)  Result Date: 12/31/2021 CLINICAL DATA:  Trauma, fall EXAM: CT HEAD WITHOUT CONTRAST TECHNIQUE: Contiguous axial images were obtained from the base of the skull through the vertex without intravenous contrast. RADIATION DOSE REDUCTION: This exam was performed according to the departmental dose-optimization program which includes automated exposure control, adjustment of the mA and/or kV according to patient size and/or use of iterative reconstruction technique. COMPARISON:  12/05/2021 FINDINGS: Brain: No acute intracranial findings are seen. There is prominence of third and both lateral ventricles. Cortical sulci are prominent. There is decreased density in the periventricular and subcortical white matter. Vascular: Scattered arterial calcifications are seen. Skull: Unremarkable. Sinuses/Orbits: Unremarkable. Other: No significant interval changes are noted.  IMPRESSION: No acute intracranial findings are seen in noncontrast CT brain. Atrophy. Small-vessel disease. Electronically Signed   By: Elmer Picker M.D.   On: 12/31/2021 13:21   CT Cervical Spine Wo Contrast  Result Date: 12/31/2021 CLINICAL DATA:  Trauma, fall EXAM: CT CERVICAL SPINE WITHOUT CONTRAST TECHNIQUE: Multidetector CT imaging of the cervical spine was performed without intravenous contrast. Multiplanar CT image reconstructions were also generated. RADIATION DOSE REDUCTION: This exam was performed according to the departmental dose-optimization program which includes automated exposure control, adjustment of the mA and/or kV according to patient size and/or use of iterative reconstruction technique. COMPARISON:  12/05/2021 FINDINGS: Alignment: There is minimal anterolisthesis at C3-C4 level with no significant interval change. Skull base and vertebrae: No recent fracture is seen. Degenerative changes are noted throughout the cervical spine. There are smooth marginated calcifications adjacent to the spinous processes of C5 and C6 vertebrae possibly ligament calcification from previous injury with no significant interval change. Soft tissues and spinal canal: There is extrinsic pressure over the ventral margin of thecal sac at multiple levels caused by posterior bony spurs at multiple levels, more so at C3-C4 level with spinal stenosis. Disc levels: There is moderate to marked encroachment of neural foramina from C3-T1 levels. There is minimal encroachment of neural foramina at C2-C3 level. Upper chest: Unremarkable. Other: There is inhomogeneous attenuation in the thyroid. There is 1.6 cm low-density nodule in the left lobe of thyroid with few coarse calcifications. IMPRESSION: No recent fracture is seen in the cervical spine. Cervical spondylosis with spinal stenosis and encroachment of neural foramina at multiple levels. No significant interval changes are noted since 12/05/2021. There is  inhomogeneous attenuation in the thyroid. There  is 1.6 cm low-density nodule with coarse calcifications in the left lobe. Follow-up thyroid sonogram in outpatient setting should be considered. Electronically Signed   By: Elmer Picker M.D.   On: 12/31/2021 13:18   DG FEMUR PORT, MIN 2 VIEWS RIGHT  Result Date: 12/31/2021 CLINICAL DATA:  Unwitnessed fall, right hip pain extending to right knee EXAM: RIGHT FEMUR PORTABLE 2 VIEW COMPARISON:  None Available. FINDINGS: There is no evidence of acute fracture. Alignment is normal. There is moderate right hip osteoarthritis. No evidence of distal femur fracture. There is tricompartment osteoarthritis of the knee with severe medial compartment joint space narrowing and chondrocalcinosis. IMPRESSION: No evidence of acute fracture involving the right femur. Note that if the patient is unable to bear weight and clinical suspicion for non-displaced hip fracture is significant, CT or MRI would be more sensitive. Tricompartment osteoarthritis of the right knee with severe medial compartment joint space narrowing and chondrocalcinosis, which can be seen in CPPD arthropathy or osteoarthritis. Electronically Signed   By: Maurine Simmering M.D.   On: 12/31/2021 12:44   DG Pelvis 1-2 Views  Result Date: 12/31/2021 CLINICAL DATA:  Unwitnessed fall EXAM: PELVIS - 1-2 VIEW COMPARISON:  CT 05/02/2021 FINDINGS: There is no evidence of acute fracture. There is moderate bilateral hip osteoarthritis. Lumbar spine degenerative changes with levoconvex curvature. Proximal hamstring calcifications. Right greater trochanter enthesophyte formation. IMPRESSION: No evidence of acute fracture on single frontal view of the pelvis. Note that if the patient is unable to bear weight and clinical suspicion for non-displaced hip fracture is significant, CT or MRI would be more sensitive. Moderate bilateral hip osteoarthritis. Electronically Signed   By: Maurine Simmering M.D.   On: 12/31/2021 12:42   DG  Chest Portable 1 View  Result Date: 12/31/2021 CLINICAL DATA:  Post fall. EXAM: PORTABLE CHEST 1 VIEW COMPARISON:  June 30, 2020. FINDINGS: EKG leads project over the chest. Cardiomediastinal contours and hilar structures are stable with signs of cardiomegaly, accentuated by portable technique and AP projection. No signs of pneumothorax. No lobar consolidation or sign of pleural effusion. RIGHT hemidiaphragm is elevated. On limited assessment there is no acute skeletal process. IMPRESSION: Signs of cardiomegaly without acute cardiopulmonary disease. Electronically Signed   By: Zetta Bills M.D.   On: 12/31/2021 12:39    Pertinent labs & imaging results that were available during my care of the patient were reviewed by me and considered in my medical decision making (see MDM for details).  Medications Ordered in ED Medications - No data to display                                                                                                                                   Procedures Procedures  (including critical care time)  Medical Decision Making / ED Course   This patient presents to the ED for concern of fall, this involves an extensive number of treatment options, and  is a complaint that carries with it a high risk of complications and morbidity.  The differential diagnosis includes fracture, ligamentous injury, sprain, contusion, hematoma  MDM: Patient seen in the emergency department for evaluation of a fall.  Physical exam with tenderness over the left scalp, left chest wall, left lower quadrant to the abdomen, left hip, left knee.  X-ray imaging of the hip, femur, knee, pelvis without fracture.  Chest x-ray unremarkable.  CT head and C-spine negative for acute fracture.  C-spine does show possible evidence of spinal stenosis which the patient is aware of and this is not a new finding.  CT chest abdomen pelvis without contrast obtained given patient's contrast allergy that shows  a right abdominal wall hematoma is otherwise negative for acute traumatic injury.  Patient's pain is controlled here in the emergency department and with overall negative acute trauma work-up, patient safe for discharge back to her facility.  Patient alert and oriented answering all questions appropriately and hemodynamically stable.  She is hypertensive at time of discharge which is likely secondary to her not taking her medications this morning.  She will take her medications on return to her facility.   Additional history obtained: -Additional history obtained from EMS -External records from outside source obtained and reviewed including: Chart review including previous notes, labs, imaging, consultation notes   Lab Tests: -I ordered, reviewed, and interpreted labs.   The pertinent results include:   Labs Reviewed - No data to display    Imaging Studies ordered: I ordered imaging studies including chest x-ray pelvis x-ray femur x-ray, CT chest abdomen pelvis, CT head, CT C-spine I independently visualized and interpreted imaging. I agree with the radiologist interpretation   Medicines ordered and prescription drug management: No orders of the defined types were placed in this encounter.   -I have reviewed the patients home medicines and have made adjustments as needed  Critical interventions none   Cardiac Monitoring: The patient was maintained on a cardiac monitor.  I personally viewed and interpreted the cardiac monitored which showed an underlying rhythm of: NSR  Social Determinants of Health:  Factors impacting patients care include: Lives in facility   Reevaluation: After the interventions noted above, I reevaluated the patient and found that they have :improved  Co morbidities that complicate the patient evaluation  Past Medical History:  Diagnosis Date   Age-related osteoporosis without current pathological fracture    Essential hypertension    Hypercalcemia     Low back pain    Mixed hyperlipidemia       Dispostion: I considered admission for this patient, but with negative trauma work-up, patient safe for discharge with outpatient follow-up. she currently does not meet inpatient criteria for admission.     Final Clinical Impression(s) / ED Diagnoses Final diagnoses:  Abdominal wall hematoma, initial encounter     '@PCDICTATION'$ @    Teressa Lower, MD 12/31/21 2034

## 2022-01-06 DIAGNOSIS — I10 Essential (primary) hypertension: Secondary | ICD-10-CM | POA: Diagnosis not present

## 2022-01-06 DIAGNOSIS — F411 Generalized anxiety disorder: Secondary | ICD-10-CM | POA: Diagnosis not present

## 2022-01-06 DIAGNOSIS — S30810A Abrasion of lower back and pelvis, initial encounter: Secondary | ICD-10-CM | POA: Diagnosis not present

## 2022-01-13 DIAGNOSIS — B351 Tinea unguium: Secondary | ICD-10-CM | POA: Diagnosis not present

## 2022-01-13 DIAGNOSIS — M79674 Pain in right toe(s): Secondary | ICD-10-CM | POA: Diagnosis not present

## 2022-01-13 DIAGNOSIS — M79675 Pain in left toe(s): Secondary | ICD-10-CM | POA: Diagnosis not present

## 2022-01-27 DIAGNOSIS — Z1331 Encounter for screening for depression: Secondary | ICD-10-CM | POA: Diagnosis not present

## 2022-01-27 DIAGNOSIS — I10 Essential (primary) hypertension: Secondary | ICD-10-CM | POA: Diagnosis not present

## 2022-01-27 DIAGNOSIS — Z0000 Encounter for general adult medical examination without abnormal findings: Secondary | ICD-10-CM | POA: Diagnosis not present

## 2022-02-12 ENCOUNTER — Encounter: Payer: Self-pay | Admitting: Urology

## 2022-02-12 ENCOUNTER — Ambulatory Visit (INDEPENDENT_AMBULATORY_CARE_PROVIDER_SITE_OTHER): Payer: Medicare Other | Admitting: Urology

## 2022-02-12 VITALS — BP 152/68 | HR 63 | Ht <= 58 in | Wt 140.2 lb

## 2022-02-12 DIAGNOSIS — Z87440 Personal history of urinary (tract) infections: Secondary | ICD-10-CM

## 2022-02-12 DIAGNOSIS — N3941 Urge incontinence: Secondary | ICD-10-CM | POA: Diagnosis not present

## 2022-02-12 LAB — BLADDER SCAN AMB NON-IMAGING: Scan Result: 53

## 2022-02-12 NOTE — Progress Notes (Signed)
post void residual =53

## 2022-02-12 NOTE — Progress Notes (Signed)
Assessment: 1. Urge incontinence   2. History of UTI     Plan: Continue Myrbetriq 50 mg daily. Continue fosfomycin 3 gm PO every 10 days for UTI prevention Return to office in 3 months  Chief Complaint:  Chief Complaint  Patient presents with   Urinary Incontinence    History of Present Illness:  Janice [name] is a 88 y.o. year old female who is seen for further evaluation of urinary incontinence and UTIs.  She had a several year history of urinary incontinence.  Associated symptoms include urinary frequency, urgency, and urge incontinence.  She was using 6 adult pull-ups per day.  She was treated for 2 UTIs, the last in 4/23.  No dysuria or gross hematuria.  She has a history of frequent UTIs.  She was previously followed by urology in [place], [place] over 1 year ago.  No records available. She had not tried any medical therapy for her incontinence. She does not have any problems with constipation or fecal incontinence.  Urine culture results: 10/22 E. Coli 1/23 E. coli  Urine culture from 11/07/2021 grew >100 K Klebsiella.  She was treated with Cipro x5 days and started on fosfomycin 3 g p.o. every 10 days for UTI prevention. She was given a trial of Myrbetriq 50 mg daily at her visit in 4/23.  At her visit in May 2023, she continued on Myrbetriq 50 mg daily.  She reported improvement in her incontinence without any recent incontinence episodes.  She continued to have some urgency but was able to make it to the bathroom before any leakage of urine.  No side effects from the medication.  She continued on fosfomycin 3 g every 10 days.  She initially noted some diarrhea after taking the medication but this improved.    She returns today for follow-up.  She continues on fosfomycin 3 g every 10 days.  She has not had any recent UTI symptoms.  No dysuria or gross hematuria.  She continues on Myrbetriq 50 mg daily.  This is controlling her urgency and incontinence well.  No  side effects.  Portions of the above documentation were copied from a prior visit for review purposes only.   Past Medical History:  Past Medical History:  Diagnosis Date   Age-related osteoporosis without current pathological fracture    Essential hypertension    Hypercalcemia    Low back pain    Mixed hyperlipidemia     Past Surgical History:  Past Surgical History:  Procedure Laterality Date   COLONOSCOPY     GALLBLADDER SURGERY     December 2021    Allergies:  Allergies  Allergen Reactions   Iodinated Contrast Media Swelling    Other reaction(s): Hypotension, Other (see comments) Other reaction(s): Hypotension Other reaction(s): Hypotension    Iodine Swelling    Other reaction(s): Abdominal Pain, Other (see comments) Other reaction(s): Abdominal Pain Other reaction(s): Abdominal Pain    Nifedipine Swelling    Other reaction(s): Other (see comments) Blood vessels swelled large in legs Blood vessels swelled large in legs Blood vessels swelled large in legs Blood vessels swelled large in legs    Penicillins Hives and Rash   Primidone     Other reaction(s): Hypertension, Other (see comments) Stroke type symptoms, slurred speech, weakness Other reaction(s): Hypertension Stroke type symptoms, slurred speech, weakness Stroke type symptoms, slurred speech, weakness Other reaction(s): Hypertension Stroke type symptoms, slurred speech, weakness    Codeine Nausea And Vomiting   Erythromycin Nausea And Vomiting  and Rash   Isosorbide     Other reaction(s): Other (see comments) Not sure Not sure Not sure Not sure    Peanut Oil     Other reaction(s): Other (see comments) Stomach cramps Stomach cramps Stomach cramps Stomach cramps    Fish Oil    Imdur [Isosorbide Nitrate]     Family History:  Family History  Problem Relation Age of Onset   AAA (abdominal aortic aneurysm) Mother    CAD Father     Social History:  Social History   Tobacco Use    Smoking status: Never  Vaping Use   Vaping Use: Never used  Substance Use Topics   Alcohol use: Never   Drug use: Never    ROS: Constitutional:  Negative for fever, chills, weight loss CV: Negative for chest pain, previous MI, hypertension Respiratory:  Negative for shortness of breath, wheezing, sleep apnea, frequent cough GI:  Negative for nausea, vomiting, bloody stool, GERD  Physical exam: BP (!) 152/68   Pulse 63   Ht '4\' 8"'$  (1.422 m)   Wt 140 lb 3.2 oz (63.6 kg)   BMI 31.43 kg/m  GENERAL APPEARANCE:  Well appearing, well developed, well nourished, NAD HEENT:  Atraumatic, normocephalic, oropharynx clear NECK:  Supple without lymphadenopathy or thyromegaly ABDOMEN:  Soft, non-tender, no masses EXTREMITIES:  Moves all extremities well, without clubbing, cyanosis, or edema NEUROLOGIC:  Alert and oriented x 3, uses walker, CN II-XII grossly intact MENTAL STATUS:  appropriate BACK:  Non-tender to palpation, No CVAT SKIN:  Warm, dry, and intact  Results: No specimen provided  PVR = 53 ml

## 2022-02-21 ENCOUNTER — Ambulatory Visit
Admission: EM | Admit: 2022-02-21 | Discharge: 2022-02-21 | Disposition: A | Discharge status: Home / Self Care | Payer: Medicare Other | Attending: Nurse Practitioner | Admitting: Nurse Practitioner

## 2022-02-21 DIAGNOSIS — M542 Cervicalgia: Secondary | ICD-10-CM | POA: Diagnosis not present

## 2022-02-21 MED ORDER — ACETAMINOPHEN ER 650 MG PO TBCR: 650.0000 mg | EXTENDED_RELEASE_TABLET | Freq: Three times a day (TID) | ORAL | 0 refills | Status: DC | PRN

## 2022-02-21 NOTE — Discharge Instructions (Signed)
Take medication as prescribed. Apply a heating pad to the neck as needed for pain or stiffness.  Apply for 20 minutes, remove for 1 hour, then repeat.  Recommend applying 3-4 times daily while symptoms persist. Gentle range of motion and stretching exercises while symptoms persist. Go to the emergency department immediately if she becomes unable to move her neck, experiences numbness, weakness or tingling in her hands or other concerns. Recommend following up with her primary care physician within the next 7 to 10 days for further evaluation and treatment.

## 2022-02-21 NOTE — ED Triage Notes (Signed)
Pt reports right sided neck pain and right shoulder pain x 1 day.  Reports she fell when she tried to hold on a tv table, tv table broke and hit her back couple weeks ago. Pain is worse when moving. Pt has not taken any meds for complaints.

## 2022-02-22 NOTE — ED Provider Notes (Signed)
[facility]    CSN: [id number] Arrival date & time: 02/21/22  1345      History   Chief Complaint Chief Complaint  Patient presents with   Neck Injury    Entered by patient    HPI Janice [name] is a 88 y.o. female.   The history is provided by the patient and a relative.   The patient is an 88 year old female brought in by her son for complaints of neck pain.  Patient states that her neck injury occurred approximately 2 days ago when she fell at the assisted living facility.  Son corrected the patient's report stating patient fell in May.  Patient had episodes of confusion, but was pleasant and would easily return to baseline.  Reviewed the patient's chart with the son and advised that patient had been seen for the same or similar injury to recent recurrent falls.  Patient complains of right-sided neck pain.  She states that the pain radiates into the shoulder and into the side of her head.  She denies fever, chills, chest pain, numbness, tingling, weakness of her hands.  Patient's son states that she has not been given any medication for her symptoms as she has to have an order to take Tylenol even at the nursing home facility.    Past Medical History:  Diagnosis Date   Age-related osteoporosis without current pathological fracture    Essential hypertension    Hypercalcemia    Low back pain    Mixed hyperlipidemia     Patient Active Problem List   Diagnosis Date Noted   History of UTI 11/07/2021   Urge incontinence 11/07/2021   Age-related osteoporosis without current pathological fracture 09/11/2020   Hyperparathyroidism, primary ([place]) 06/11/2020   Hypercalcemia 05/30/2020    Past Surgical History:  Procedure Laterality Date   COLONOSCOPY     GALLBLADDER SURGERY     December 2021    OB History   No obstetric history on file.      Home Medications    Prior to Admission medications   Medication Sig Start Date End Date Taking? Authorizing  Provider  alendronate (FOSAMAX) 70 MG tablet Take 70 mg by mouth once a week. 05/28/20   [provider]  atenolol (TENORMIN) 50 MG tablet Take 50 mg by mouth daily. 03/18/20   [provider]  chlordiazePOXIDE (LIBRIUM) 10 MG capsule Take 10 mg by mouth daily as needed for anxiety.    [provider]  Cholecalciferol (VITAMIN D-3) 25 MCG (1000 UT) CAPS Take 1 capsule by mouth daily.    [provider]  cinacalcet (SENSIPAR) 30 MG tablet Take 1 tablet (30 mg total) by mouth 2 (two) times daily with a meal. 11/20/21   Nida, Marella Chimes, MD  CRANBERRY CONCENTRATE PO Take 1 tablet by mouth daily.    [provider]  DICLOFENAC PO Take 1 tablet by mouth 2 (two) times daily.    [provider]  DULoxetine (CYMBALTA) 20 MG capsule Take 20 mg by mouth daily as needed.    [provider]  fenofibrate 54 MG tablet Take 1 tablet by mouth daily. 04/23/20   [provider]  fluticasone (FLONASE) 50 MCG/ACT nasal spray Place 1 spray into both nostrils daily.    [provider]  fosfomycin (MONUROL) 3 g PACK Take 3 gm by mouth every 10 days for UTI prevention 11/11/21   Stoneking, Reece Leader., MD  furosemide (LASIX) 20 MG tablet Take 20 mg by mouth  daily. 03/12/20   [provider]  LORazepam (ATIVAN) 0.5 MG tablet Take 0.5 mg by mouth 3 (three) times daily as needed. 09/30/21   [provider]  losartan (COZAAR) 100 MG tablet Take 100 mg by mouth daily. 06/03/20   [provider]  mirabegron ER (MYRBETRIQ) 50 MG TB24 tablet Take 1 tablet (50 mg total) by mouth daily. 11/07/21   Stoneking, Reece Leader., MD  Multiple Vitamins-Minerals (OCUVITE PO) Take by mouth.    [provider]  olmesartan (BENICAR) 40 MG tablet Take 40 mg by mouth daily. 05/25/21   [provider]  pantoprazole (PROTONIX) 40 MG tablet Take 40 mg by mouth daily. 04/25/20   [provider]  pramipexole (MIRAPEX) 0.25 MG  tablet Take 1 tablet by mouth every 12 (twelve) hours. 08/15/13   [provider]  RABEprazole (ACIPHEX) 20 MG tablet Take 20 mg by mouth daily.    [provider]  Red Yeast Rice Extract (RED YEAST RICE PO) Take 1 tablet by mouth daily.    [provider]  traMADol (ULTRAM) 50 MG tablet Take 50 mg by mouth 2 (two) times daily.    [provider]  vitamin B-12 (CYANOCOBALAMIN) 1000 MCG tablet Take 1,000 mcg by mouth daily.    [provider]    Family History Family History  Problem Relation Age of Onset   AAA (abdominal aortic aneurysm) Mother    CAD Father     Social History Social History   Tobacco Use   Smoking status: Never  Vaping Use   Vaping Use: Never used  Substance Use Topics   Alcohol use: Never   Drug use: Never     Allergies   Iodinated contrast media, Iodine, Nifedipine, Penicillins, Primidone, Codeine, Erythromycin, Isosorbide, Peanut oil, Fish oil, Imdur [isosorbide nitrate], and Shellfish allergy   Review of Systems Review of Systems Per HPI  Physical Exam Triage Vital Signs ED Triage Vitals  Enc Vitals Group     BP 02/21/22 1402 (!) 165/70     Pulse Rate 02/21/22 1402 60     Resp 02/21/22 1402 18     Temp 02/21/22 1402 98 F (36.7 C)     Temp Source 02/21/22 1402 Oral     SpO2 02/21/22 1402 92 %     Weight --      Height --      Head Circumference --      Peak Flow --      Pain Score 02/21/22 1406 6     Pain Loc --      Pain Edu? --      Excl. in [place]? --    No data found.  Updated Vital Signs BP (!) 165/70 (BP Location: Right Arm)   Pulse 60   Temp 98 F (36.7 C) (Oral)   Resp 18   SpO2 92%   Visual Acuity Right Eye Distance:   Left Eye Distance:   Bilateral Distance:    Right Eye Near:   Left Eye Near:    Bilateral Near:     Physical Exam Vitals and nursing note reviewed.  Constitutional:      General: She is not in acute distress.    Appearance: Normal appearance.  HENT:      Head: Normocephalic.     Mouth/Throat:     Mouth: Mucous membranes are moist.  Eyes:     Extraocular Movements: Extraocular movements intact.     Conjunctiva/sclera: Conjunctivae normal.  Pupils: Pupils are equal, round, and reactive to light.  Cardiovascular:     Rate and Rhythm: Normal rate and regular rhythm.     Pulses: Normal pulses.     Heart sounds: Normal heart sounds.  Pulmonary:     Effort: Pulmonary effort is normal.     Breath sounds: Normal breath sounds.  Abdominal:     General: Bowel sounds are normal.     Palpations: Abdomen is soft.  Musculoskeletal:     Cervical back: Tenderness (Right posterior neck) present. No edema, erythema, rigidity, torticollis or crepitus. Pain with movement and muscular tenderness present. Decreased range of motion.  Lymphadenopathy:     Cervical: No cervical adenopathy.  Skin:    General: Skin is warm and dry.  Neurological:     General: No focal deficit present.     Mental Status: She is alert and oriented to person, place, and time.  Psychiatric:        Mood and Affect: Mood normal.        Behavior: Behavior normal.      UC Treatments / Results  Labs (all labs ordered are listed, but only abnormal results are displayed) Labs Reviewed - No data to display  EKG   Radiology No results found.  Procedures Procedures (including critical care time)  Medications Ordered in UC Medications - No data to display  Initial Impression / Assessment and Plan / UC Course  I have reviewed the triage vital signs and the nursing notes.  Pertinent labs & imaging results that were available during my care of the patient were reviewed by me and considered in my medical decision making (see chart for details).  Patient brought in for neck pain by her son.  Difficult to ascertain exactly when the patient injured her neck, but according to her chart review, it appears that may have occurred in March of this year.  Patient continues to  complain of pain with movement, pain is located in the right neck, but she states that sometimes moves to the left side of her neck.  Discussion with the patient's son that previous imaging was performed therefore will not repeat any imaging today.  X-rays show spondylosis, explained to the son that this is due to normal wear and tear.  Falls likely exacerbated symptoms due to normal wear and tear.  Patient was prescribed Tylenol at this time.  Explained to son that due to her recurrent history of falls, do not feel that muscle relaxers are appropriate.  Recommend that he follow-up with her primary care physician or orthopedics to discuss possible physical therapy.  Supportive care recommendations were provided to the patient's son, patient son was advised to follow-up with his mother as needed. Final Clinical Impressions(s) / UC Diagnoses   Final diagnoses:  Neck pain     Discharge Instructions      Take medication as prescribed. Apply a heating pad to the neck as needed for pain or stiffness.  Apply for 20 minutes, remove for 1 hour, then repeat.  Recommend applying 3-4 times daily while symptoms persist. Gentle range of motion and stretching exercises while symptoms persist. Go to the emergency department immediately if she becomes unable to move her neck, experiences numbness, weakness or tingling in her hands or other concerns. Recommend following up with her primary care physician within the next 7 to 10 days for further evaluation and treatment.    ED Prescriptions     Medication Sig Dispense Auth. Provider  acetaminophen (TYLENOL 8 HOUR) 650 MG CR tablet  (Status: Discontinued) Take 1 tablet (650 mg total) by mouth every 8 (eight) hours as needed for pain. 45 tablet Josclyn Rosales-Warren, Alda Lea, NP      PDMP not reviewed this encounter.   Tish Men, NP 02/22/22 [phone]

## 2022-03-03 DIAGNOSIS — N390 Urinary tract infection, site not specified: Secondary | ICD-10-CM | POA: Diagnosis not present

## 2022-03-03 DIAGNOSIS — E782 Mixed hyperlipidemia: Secondary | ICD-10-CM | POA: Diagnosis not present

## 2022-03-03 DIAGNOSIS — M545 Low back pain, unspecified: Secondary | ICD-10-CM | POA: Diagnosis not present

## 2022-03-03 DIAGNOSIS — M810 Age-related osteoporosis without current pathological fracture: Secondary | ICD-10-CM | POA: Diagnosis not present

## 2022-03-03 DIAGNOSIS — F0394 Unspecified dementia, unspecified severity, with anxiety: Secondary | ICD-10-CM | POA: Diagnosis not present

## 2022-03-03 DIAGNOSIS — I10 Essential (primary) hypertension: Secondary | ICD-10-CM | POA: Diagnosis not present

## 2022-03-03 DIAGNOSIS — I872 Venous insufficiency (chronic) (peripheral): Secondary | ICD-10-CM | POA: Diagnosis not present

## 2022-03-03 DIAGNOSIS — F32A Depression, unspecified: Secondary | ICD-10-CM | POA: Diagnosis not present

## 2022-03-13 DIAGNOSIS — E210 Primary hyperparathyroidism: Secondary | ICD-10-CM | POA: Diagnosis not present

## 2022-03-14 LAB — PTH, INTACT AND CALCIUM
Calcium: 8.9 mg/dL (ref 8.7–10.3)
PTH: 144 pg/mL — ABNORMAL HIGH (ref 15–65)

## 2022-03-15 DIAGNOSIS — F331 Major depressive disorder, recurrent, moderate: Secondary | ICD-10-CM | POA: Diagnosis not present

## 2022-03-15 DIAGNOSIS — I158 Other secondary hypertension: Secondary | ICD-10-CM | POA: Diagnosis not present

## 2022-03-17 ENCOUNTER — Encounter: Payer: Self-pay | Admitting: "Endocrinology

## 2022-03-17 ENCOUNTER — Ambulatory Visit (INDEPENDENT_AMBULATORY_CARE_PROVIDER_SITE_OTHER): Payer: Medicare Other | Admitting: "Endocrinology

## 2022-03-17 VITALS — BP 144/64 | HR 64 | Ht <= 58 in | Wt 139.0 lb

## 2022-03-17 DIAGNOSIS — E210 Primary hyperparathyroidism: Secondary | ICD-10-CM

## 2022-03-17 DIAGNOSIS — M810 Age-related osteoporosis without current pathological fracture: Secondary | ICD-10-CM

## 2022-03-17 NOTE — Progress Notes (Signed)
Endocrinology follow-up note    03/17/2022, 6:57 PM  Janice [name] is a 88 y.o.-year-old female, being seen in follow-up for hypercalcemia/hyperparathyroidism. PMD: Janice Burly, MD .   Past Medical History:  Diagnosis Date   Age-related osteoporosis without current pathological fracture    Essential hypertension    Hypercalcemia    Low back pain    Mixed hyperlipidemia     Past Surgical History:  Procedure Laterality Date   COLONOSCOPY     GALLBLADDER SURGERY     December 2021    Social History   Tobacco Use   Smoking status: Never  Vaping Use   Vaping Use: Never used  Substance Use Topics   Alcohol use: Never   Drug use: Never    Family History  Problem Relation Age of Onset   AAA (abdominal aortic aneurysm) Mother    CAD Father     Outpatient Encounter Medications as of 03/17/2022  Medication Sig   alendronate (FOSAMAX) 70 MG tablet Take 70 mg by mouth once a week.   atenolol (TENORMIN) 50 MG tablet Take 50 mg by mouth daily.   chlordiazePOXIDE (LIBRIUM) 10 MG capsule Take 10 mg by mouth daily as needed for anxiety.   Cholecalciferol (VITAMIN D-3) 25 MCG (1000 UT) CAPS Take 1 capsule by mouth daily.   cinacalcet (SENSIPAR) 30 MG tablet Take 1 tablet (30 mg total) by mouth 2 (two) times daily with a meal.   CRANBERRY CONCENTRATE PO Take 1 tablet by mouth daily.   DICLOFENAC PO Take 1 tablet by mouth 2 (two) times daily.   DULoxetine (CYMBALTA) 20 MG capsule Take 20 mg by mouth daily as needed.   fenofibrate 54 MG tablet Take 1 tablet by mouth daily.   fluticasone (FLONASE) 50 MCG/ACT nasal spray Place 1 spray into both nostrils daily.   fosfomycin (MONUROL) 3 g PACK Take 3 gm by mouth every 10 days for UTI prevention   furosemide (LASIX) 20 MG tablet Take 20 mg by mouth daily.   LORazepam (ATIVAN) 0.5 MG tablet Take 0.5 mg by mouth 3 (three) times daily as needed.   losartan  (COZAAR) 100 MG tablet Take 100 mg by mouth daily.   mirabegron ER (MYRBETRIQ) 50 MG TB24 tablet Take 1 tablet (50 mg total) by mouth daily.   Multiple Vitamins-Minerals (OCUVITE PO) Take by mouth.   olmesartan (BENICAR) 40 MG tablet Take 40 mg by mouth daily.   pantoprazole (PROTONIX) 40 MG tablet Take 40 mg by mouth daily.   pramipexole (MIRAPEX) 0.25 MG tablet Take 1 tablet by mouth every 12 (twelve) hours.   RABEprazole (ACIPHEX) 20 MG tablet Take 20 mg by mouth daily.   Red Yeast Rice Extract (RED YEAST RICE PO) Take 1 tablet by mouth daily.   traMADol (ULTRAM) 50 MG tablet Take 50 mg by mouth 2 (two) times daily.   vitamin B-12 (CYANOCOBALAMIN) 1000 MCG tablet Take 1,000 mcg by mouth daily.   No facility-administered encounter medications on file as of 03/17/2022.    Allergies  Allergen Reactions   Iodinated Contrast Media Swelling    Other reaction(s): Hypotension, Other (see  comments) Other reaction(s): Hypotension Other reaction(s): Hypotension    Iodine Swelling    Other reaction(s): Abdominal Pain, Other (see comments) Other reaction(s): Abdominal Pain Other reaction(s): Abdominal Pain    Nifedipine Swelling    Other reaction(s): Other (see comments) Blood vessels swelled large in legs Blood vessels swelled large in legs Blood vessels swelled large in legs Blood vessels swelled large in legs    Penicillins Hives and Rash   Primidone     Other reaction(s): Hypertension, Other (see comments) Stroke type symptoms, slurred speech, weakness Other reaction(s): Hypertension Stroke type symptoms, slurred speech, weakness Stroke type symptoms, slurred speech, weakness Other reaction(s): Hypertension Stroke type symptoms, slurred speech, weakness    Codeine Nausea And Vomiting   Erythromycin Nausea And Vomiting and Rash   Isosorbide     Other reaction(s): Other (see comments) Not sure Not sure Not sure Not sure    Peanut Oil     Other reaction(s): Other (see  comments) Stomach cramps Stomach cramps Stomach cramps Stomach cramps    Fish Oil    Imdur [Isosorbide Nitrate]    Shellfish Allergy      HPI  Janice [name] was diagnosed with hypercalcemia in September 2021.  Her previsit labs are consistent with hypercalcemia due to primary hyperparathyroidism.    Patient was diagnosed with primary hyperparathyroidism, not a surgical candidate.  She was started on Sensipar 30 mg p.o. twice daily.   He is accompanied by her aide to clinic.  Her previsit labs show controlled calcium at 8.9 improving from 11.2.  Her PTH is still high at 144.   -Reportedly, she underwent bone density at her primary care doctor's office.  Reports are not available to review today.   she is on alendronate 70 mg p.o. weekly.   She has history of wrist fracture, scoliosis with loss of height.  She is assisted by her son Janice [name] during this visit.    No history of  kidney stones.  No history of CKD.  she is not on HCTZ or other thiazide therapy.  Her vitamin D status is not known. she is not on calcium supplements,  she eats dairy and green, leafy, vegetables on average amounts.  she does not have a family history of hypercalcemia, pituitary tumors, thyroid cancer, or osteoporosis.  -She walks with a walker due to disequilibrium.   ROS:  PE: BP (!) 144/64   Pulse 64   Ht '4\' 8"'$  (1.422 m)   Wt 139 lb (63 kg)   BMI 31.16 kg/m , Body mass index is 31.16 kg/m. Wt Readings from Last 3 Encounters:  03/17/22 139 lb (63 kg)  02/12/22 140 lb 3.2 oz (63.6 kg)  12/31/21 140 lb (63.5 kg)     April 18, 2020: Labs show calcium of 11.4  Recent Results (from the past 2160 hour(s))  BLADDER SCAN AMB NON-IMAGING     Status: None   Collection Time: 02/12/22 10:42 AM  Result Value Ref Range   Scan Result 53   PTH, intact and calcium     Status: Abnormal   Collection Time: 03/13/22  7:41 AM  Result Value Ref Range   Calcium 8.9 8.7 - 10.3 mg/dL   PTH 144 (H)  15 - 65 pg/mL   PTH Interp Comment     Comment: Interpretation                 Intact PTH    Calcium                                 (  pg/mL)      (mg/dL) Normal                          15 - 65     8.6 - 10.2 Primary Hyperparathyroidism         >65          >10.2 Secondary Hyperparathyroidism       >65          <10.2 Non-Parathyroid Hypercalcemia       <65          >10.2 Hypoparathyroidism                  <15          < 8.6 Non-Parathyroid Hypocalcemia    15 - 65          < 8.6     Assessment: 1. Hypercalcemia /hyperparathyroidism 2.  Osteoporosis  Plan: She has hypercalcemia from primary hyperparathyroidism, not a surgical candidate.  She has responded again to Sensipar intervention.  She is advised to continue Sensipar 30 mg p.o. twice daily with breakfast and supper with plan to repeat PTH/calcium in 6 months.     -She has medical history of osteoporosis on treatment.  She denies any history of nephrolithiasis.  She has taken alendronate reportedly since her 74s.  She may be considered for drug holiday after her next bone density.  I recommended her bone CT repeated on the same facility and machine in Dr. Ralph Dowdy office.     I spent 22 minutes in the care of the patient today including review of labs from Thyroid Function, CMP, and other relevant labs ; imaging/biopsy records (current and previous including abstractions from other facilities); face-to-face time discussing  her lab results and symptoms, medications doses, her options of short and long term treatment based on the latest standards of care / guidelines;   and documenting the encounter.  Cephus Slater  participated in the discussions, expressed understanding, and voiced agreement with the above plans.  All questions were answered to her satisfaction. she is encouraged to contact clinic should she have any questions or concerns prior to her return visit.   - Return in about 6 months (around 09/17/2022) for  F/U with Pre-visit Labs.   Glade Lloyd, MD [facility] [address] [place], [place] [address] Phone: [phone]  Fax: [phone]    This note was partially dictated with voice recognition software. Similar sounding words can be transcribed inadequately or may not  be corrected upon review.  03/17/2022, 6:57 PM

## 2022-04-01 DIAGNOSIS — F331 Major depressive disorder, recurrent, moderate: Secondary | ICD-10-CM | POA: Diagnosis not present

## 2022-04-01 DIAGNOSIS — F0394 Unspecified dementia, unspecified severity, with anxiety: Secondary | ICD-10-CM | POA: Diagnosis not present

## 2022-04-07 DIAGNOSIS — M79675 Pain in left toe(s): Secondary | ICD-10-CM | POA: Diagnosis not present

## 2022-04-07 DIAGNOSIS — M79674 Pain in right toe(s): Secondary | ICD-10-CM | POA: Diagnosis not present

## 2022-04-07 DIAGNOSIS — B351 Tinea unguium: Secondary | ICD-10-CM | POA: Diagnosis not present

## 2022-05-06 ENCOUNTER — Other Ambulatory Visit (HOSPITAL_COMMUNITY): Payer: Self-pay

## 2022-05-10 DIAGNOSIS — F331 Major depressive disorder, recurrent, moderate: Secondary | ICD-10-CM | POA: Diagnosis not present

## 2022-05-10 DIAGNOSIS — F0394 Unspecified dementia, unspecified severity, with anxiety: Secondary | ICD-10-CM | POA: Diagnosis not present

## 2022-05-12 ENCOUNTER — Encounter: Payer: Self-pay | Admitting: Urology

## 2022-05-12 ENCOUNTER — Ambulatory Visit (INDEPENDENT_AMBULATORY_CARE_PROVIDER_SITE_OTHER): Payer: Medicare Other | Admitting: Urology

## 2022-05-12 VITALS — BP 181/83 | HR 61

## 2022-05-12 DIAGNOSIS — Z87440 Personal history of urinary (tract) infections: Secondary | ICD-10-CM

## 2022-05-12 DIAGNOSIS — N3941 Urge incontinence: Secondary | ICD-10-CM

## 2022-05-12 NOTE — Progress Notes (Signed)
Assessment: 1. Urge incontinence   2. History of UTI     Plan: Continue Myrbetriq 50 mg daily. Discontinue fosfomycin We will see how she does off of the antibiotic suppression. Please obtain urinalysis and urine culture for any UTI symptoms. Return to office in 3 months  Chief Complaint:  Chief Complaint  Patient presents with   Recurrent UTI    History of Present Illness:  Janice [name] is a 88 y.o. year old female who is seen for further evaluation of urinary incontinence and UTIs.  She had a several year history of urinary incontinence.  Associated symptoms include urinary frequency, urgency, and urge incontinence.  She was using 6 adult pull-ups per day.  She was treated for 2 UTIs, the last in 4/23.  No dysuria or gross hematuria.  She has a history of frequent UTIs.  She was previously followed by urology in [place], [place] over 1 year ago.  No records available. She had not tried any medical therapy for her incontinence. She does not have any problems with constipation or fecal incontinence.  Urine culture results: 10/22 E. Coli 1/23 E. coli  Urine culture from 11/07/2021 grew >100 K Klebsiella.  She was treated with Cipro x5 days and started on fosfomycin 3 g p.o. every 10 days for UTI prevention. She was given a trial of Myrbetriq 50 mg daily at her visit in 4/23.  At her visit in May 2023, she continued on Myrbetriq 50 mg daily.  She reported improvement in her incontinence without any recent incontinence episodes.  She continued to have some urgency but was able to make it to the bathroom before any leakage of urine.  No side effects from the medication.  She continued on fosfomycin 3 g every 10 days.  She initially noted some diarrhea after taking the medication but this improved.   At her visit in July 2023, she continued on fosfomycin 3 g every 10 days.  She had not had any recent UTI symptoms.  No dysuria or gross hematuria.  She continued on Myrbetriq  50 mg daily which was controlling her urgency and incontinence well.  No side effects.  She returns today for follow-up.  She continues on Myrbetriq 50 mg daily with good control of her incontinence symptoms.  She continues on fosfomycin 3 g every 10 days.  She has not received a dose since 04/26/2022.  She continues to report problems with diarrhea after taking the medication.  No recent UTI symptoms.  No dysuria or gross hematuria.  Portions of the above documentation were copied from a prior visit for review purposes only.   Past Medical History:  Past Medical History:  Diagnosis Date   Age-related osteoporosis without current pathological fracture    Essential hypertension    Hypercalcemia    Low back pain    Mixed hyperlipidemia     Past Surgical History:  Past Surgical History:  Procedure Laterality Date   COLONOSCOPY     GALLBLADDER SURGERY     December 2021    Allergies:  Allergies  Allergen Reactions   Iodinated Contrast Media Swelling    Other reaction(s): Hypotension, Other (see comments) Other reaction(s): Hypotension Other reaction(s): Hypotension    Iodine Swelling    Other reaction(s): Abdominal Pain, Other (see comments) Other reaction(s): Abdominal Pain Other reaction(s): Abdominal Pain    Nifedipine Swelling    Other reaction(s): Other (see comments) Blood vessels swelled large in legs Blood vessels swelled large in legs Blood vessels  swelled large in legs Blood vessels swelled large in legs    Penicillins Hives and Rash   Primidone     Other reaction(s): Hypertension, Other (see comments) Stroke type symptoms, slurred speech, weakness Other reaction(s): Hypertension Stroke type symptoms, slurred speech, weakness Stroke type symptoms, slurred speech, weakness Other reaction(s): Hypertension Stroke type symptoms, slurred speech, weakness    Codeine Nausea And Vomiting   Erythromycin Nausea And Vomiting and Rash   Isosorbide     Other  reaction(s): Other (see comments) Not sure Not sure Not sure Not sure    Peanut Oil     Other reaction(s): Other (see comments) Stomach cramps Stomach cramps Stomach cramps Stomach cramps    Fish Oil    Imdur [Isosorbide Nitrate]    Shellfish Allergy     Family History:  Family History  Problem Relation Age of Onset   AAA (abdominal aortic aneurysm) Mother    CAD Father     Social History:  Social History   Tobacco Use   Smoking status: Never  Vaping Use   Vaping Use: Never used  Substance Use Topics   Alcohol use: Never   Drug use: Never    ROS: Constitutional:  Negative for fever, chills, weight loss CV: Negative for chest pain, previous MI, hypertension Respiratory:  Negative for shortness of breath, wheezing, sleep apnea, frequent cough GI:  Negative for nausea, vomiting, bloody stool, GERD  Physical exam: BP (!) 181/83   Pulse 61  GENERAL APPEARANCE:  Well appearing, well developed, well nourished, NAD HEENT:  Atraumatic, normocephalic, oropharynx clear NECK:  Supple without lymphadenopathy or thyromegaly ABDOMEN:  Soft, non-tender, no masses EXTREMITIES:  Moves all extremities well, without clubbing, cyanosis, or edema NEUROLOGIC:  Alert and oriented x 3, CN II-XII grossly intact MENTAL STATUS:  appropriate BACK:  Non-tender to palpation, No CVAT SKIN:  Warm, dry, and intact  Results: Unable to provide specimen  Bladder scan = 69 mL

## 2022-05-12 NOTE — Progress Notes (Signed)
post void residual=69 

## 2022-05-14 DIAGNOSIS — I872 Venous insufficiency (chronic) (peripheral): Secondary | ICD-10-CM | POA: Diagnosis not present

## 2022-05-14 DIAGNOSIS — N390 Urinary tract infection, site not specified: Secondary | ICD-10-CM | POA: Diagnosis not present

## 2022-05-14 DIAGNOSIS — M545 Low back pain, unspecified: Secondary | ICD-10-CM | POA: Diagnosis not present

## 2022-05-14 DIAGNOSIS — M810 Age-related osteoporosis without current pathological fracture: Secondary | ICD-10-CM | POA: Diagnosis not present

## 2022-05-14 DIAGNOSIS — F331 Major depressive disorder, recurrent, moderate: Secondary | ICD-10-CM | POA: Diagnosis not present

## 2022-05-14 DIAGNOSIS — F419 Anxiety disorder, unspecified: Secondary | ICD-10-CM | POA: Diagnosis not present

## 2022-05-14 DIAGNOSIS — E782 Mixed hyperlipidemia: Secondary | ICD-10-CM | POA: Diagnosis not present

## 2022-05-14 DIAGNOSIS — I10 Essential (primary) hypertension: Secondary | ICD-10-CM | POA: Diagnosis not present

## 2022-05-22 DIAGNOSIS — Z79899 Other long term (current) drug therapy: Secondary | ICD-10-CM | POA: Diagnosis not present

## 2022-05-27 DIAGNOSIS — F331 Major depressive disorder, recurrent, moderate: Secondary | ICD-10-CM | POA: Diagnosis not present

## 2022-05-27 DIAGNOSIS — F419 Anxiety disorder, unspecified: Secondary | ICD-10-CM | POA: Diagnosis not present

## 2022-05-27 DIAGNOSIS — F0394 Unspecified dementia, unspecified severity, with anxiety: Secondary | ICD-10-CM | POA: Diagnosis not present

## 2022-06-08 DIAGNOSIS — N390 Urinary tract infection, site not specified: Secondary | ICD-10-CM | POA: Diagnosis not present

## 2022-06-09 DIAGNOSIS — E782 Mixed hyperlipidemia: Secondary | ICD-10-CM | POA: Diagnosis not present

## 2022-06-09 DIAGNOSIS — I10 Essential (primary) hypertension: Secondary | ICD-10-CM | POA: Diagnosis not present

## 2022-06-09 DIAGNOSIS — M810 Age-related osteoporosis without current pathological fracture: Secondary | ICD-10-CM | POA: Diagnosis not present

## 2022-06-09 DIAGNOSIS — M545 Low back pain, unspecified: Secondary | ICD-10-CM | POA: Diagnosis not present

## 2022-06-09 DIAGNOSIS — N390 Urinary tract infection, site not specified: Secondary | ICD-10-CM | POA: Diagnosis not present

## 2022-06-09 DIAGNOSIS — I872 Venous insufficiency (chronic) (peripheral): Secondary | ICD-10-CM | POA: Diagnosis not present

## 2022-06-18 DIAGNOSIS — F419 Anxiety disorder, unspecified: Secondary | ICD-10-CM | POA: Diagnosis not present

## 2022-06-18 DIAGNOSIS — F331 Major depressive disorder, recurrent, moderate: Secondary | ICD-10-CM | POA: Diagnosis not present

## 2022-06-19 DIAGNOSIS — I10 Essential (primary) hypertension: Secondary | ICD-10-CM | POA: Diagnosis not present

## 2022-06-19 DIAGNOSIS — F331 Major depressive disorder, recurrent, moderate: Secondary | ICD-10-CM | POA: Diagnosis not present

## 2022-06-19 DIAGNOSIS — F419 Anxiety disorder, unspecified: Secondary | ICD-10-CM | POA: Diagnosis not present

## 2022-06-19 DIAGNOSIS — E782 Mixed hyperlipidemia: Secondary | ICD-10-CM | POA: Diagnosis not present

## 2022-06-19 DIAGNOSIS — M810 Age-related osteoporosis without current pathological fracture: Secondary | ICD-10-CM | POA: Diagnosis not present

## 2022-06-19 DIAGNOSIS — F0394 Unspecified dementia, unspecified severity, with anxiety: Secondary | ICD-10-CM | POA: Diagnosis not present

## 2022-06-19 DIAGNOSIS — I872 Venous insufficiency (chronic) (peripheral): Secondary | ICD-10-CM | POA: Diagnosis not present

## 2022-06-23 ENCOUNTER — Other Ambulatory Visit: Payer: Self-pay

## 2022-06-23 ENCOUNTER — Emergency Department (HOSPITAL_COMMUNITY)
Admission: EM | Admit: 2022-06-23 | Discharge: 2022-06-23 | Disposition: A | Discharge status: Home / Self Care | Payer: Medicare Other | Attending: Emergency Medicine | Admitting: Emergency Medicine

## 2022-06-23 ENCOUNTER — Emergency Department (HOSPITAL_COMMUNITY): Payer: Medicare Other

## 2022-06-23 DIAGNOSIS — W19XXXA Unspecified fall, initial encounter: Secondary | ICD-10-CM

## 2022-06-23 DIAGNOSIS — S0181XA Laceration without foreign body of other part of head, initial encounter: Secondary | ICD-10-CM

## 2022-06-23 DIAGNOSIS — S0990XA Unspecified injury of head, initial encounter: Secondary | ICD-10-CM | POA: Diagnosis not present

## 2022-06-23 DIAGNOSIS — M2578 Osteophyte, vertebrae: Secondary | ICD-10-CM | POA: Diagnosis not present

## 2022-06-23 DIAGNOSIS — I959 Hypotension, unspecified: Secondary | ICD-10-CM | POA: Diagnosis not present

## 2022-06-23 DIAGNOSIS — S0993XA Unspecified injury of face, initial encounter: Secondary | ICD-10-CM | POA: Diagnosis present

## 2022-06-23 DIAGNOSIS — Z79899 Other long term (current) drug therapy: Secondary | ICD-10-CM | POA: Diagnosis not present

## 2022-06-23 DIAGNOSIS — W06XXXA Fall from bed, initial encounter: Secondary | ICD-10-CM | POA: Insufficient documentation

## 2022-06-23 DIAGNOSIS — R001 Bradycardia, unspecified: Secondary | ICD-10-CM | POA: Diagnosis not present

## 2022-06-23 DIAGNOSIS — M4312 Spondylolisthesis, cervical region: Secondary | ICD-10-CM | POA: Diagnosis not present

## 2022-06-23 DIAGNOSIS — M79674 Pain in right toe(s): Secondary | ICD-10-CM | POA: Diagnosis not present

## 2022-06-23 DIAGNOSIS — S199XXA Unspecified injury of neck, initial encounter: Secondary | ICD-10-CM | POA: Diagnosis not present

## 2022-06-23 DIAGNOSIS — M79675 Pain in left toe(s): Secondary | ICD-10-CM | POA: Diagnosis not present

## 2022-06-23 DIAGNOSIS — S022XXA Fracture of nasal bones, initial encounter for closed fracture: Secondary | ICD-10-CM | POA: Diagnosis not present

## 2022-06-23 DIAGNOSIS — B351 Tinea unguium: Secondary | ICD-10-CM | POA: Diagnosis not present

## 2022-06-23 DIAGNOSIS — S01111A Laceration without foreign body of right eyelid and periocular area, initial encounter: Secondary | ICD-10-CM | POA: Insufficient documentation

## 2022-06-23 DIAGNOSIS — M4802 Spinal stenosis, cervical region: Secondary | ICD-10-CM | POA: Diagnosis not present

## 2022-06-23 LAB — CBC WITH DIFFERENTIAL/PLATELET
Abs Immature Granulocytes: 0.02 10*3/uL (ref 0.00–0.07)
Basophils Absolute: 0 10*3/uL (ref 0.0–0.1)
Basophils Relative: 0 %
Eosinophils Absolute: 0.2 10*3/uL (ref 0.0–0.5)
Eosinophils Relative: 3 %
HCT: 41.3 % (ref 36.0–46.0)
Hemoglobin: 13.6 g/dL (ref 12.0–15.0)
Immature Granulocytes: 0 %
Lymphocytes Relative: 38 %
Lymphs Abs: 2.5 10*3/uL (ref 0.7–4.0)
MCH: 29.8 pg (ref 26.0–34.0)
MCHC: 32.9 g/dL (ref 30.0–36.0)
MCV: 90.6 fL (ref 80.0–100.0)
Monocytes Absolute: 0.7 10*3/uL (ref 0.1–1.0)
Monocytes Relative: 11 %
Neutro Abs: 3.2 10*3/uL (ref 1.7–7.7)
Neutrophils Relative %: 48 %
Platelets: 243 10*3/uL (ref 150–400)
RBC: 4.56 MIL/uL (ref 3.87–5.11)
RDW: 13.2 % (ref 11.5–15.5)
WBC: 6.7 10*3/uL (ref 4.0–10.5)
nRBC: 0 % (ref 0.0–0.2)

## 2022-06-23 LAB — BASIC METABOLIC PANEL
Anion gap: 8 (ref 5–15)
BUN: 19 mg/dL (ref 8–23)
CO2: 24 mmol/L (ref 22–32)
Calcium: 8.9 mg/dL (ref 8.9–10.3)
Chloride: 109 mmol/L (ref 98–111)
Creatinine, Ser: 0.82 mg/dL (ref 0.44–1.00)
GFR, Estimated: >60 mL/min (ref >60)
Glucose, Bld: 93 mg/dL (ref 70–99)
Potassium: 3.8 mmol/L (ref 3.5–5.1)
Sodium: 141 mmol/L (ref 135–145)

## 2022-06-23 MED ORDER — LIDOCAINE-EPINEPHRINE-TETRACAINE (LET) TOPICAL GEL
3.0000 mL | Freq: Once | TOPICAL | Status: AC
  Administered 2022-06-23: 3 mL via TOPICAL
  Filled 2022-06-23: qty 3

## 2022-06-23 NOTE — ED Triage Notes (Signed)
Pt BIB [facility] from [facility]. EMS states pt rolled out of bed hitting her head on a night stand. Pt has small lac to right eye, no active bleeding at this time.   Pt does have hx of dementia

## 2022-06-23 NOTE — ED Notes (Signed)
Pt unable to be discharged from [facility] due to error in stating handoff to SNF has not been completed. Charge Nurse and ED Director updated and unable to find solution. Pt discharged from ED with son. Moved OTF until resolution can be found.

## 2022-06-23 NOTE — ED Provider Notes (Signed)
[facility] EMERGENCY DEPARTMENT Provider Note   CSN: [id number] Arrival date & time: 06/23/22  8182     History  Chief Complaint  Patient presents with   Janice [name] is a 88 y.o. female.   Fall  Patient presents after fall.  Reportedly rolled out of bed hitting her head on the nursing home.  Small laceration in her eye.  No other complaints.  States she landed on her knees.  Not on blood thinners.  Patient is awake and at her apparent baseline.  Reported does have a history of dementia.     Home Medications Prior to Admission medications   Medication Sig Start Date End Date Taking? Authorizing Provider  alendronate (FOSAMAX) 70 MG tablet Take 70 mg by mouth once a week. 05/28/20   [provider]  atenolol (TENORMIN) 50 MG tablet Take 50 mg by mouth daily. 03/18/20   [provider]  chlordiazePOXIDE (LIBRIUM) 10 MG capsule Take 10 mg by mouth daily as needed for anxiety.    [provider]  Cholecalciferol (VITAMIN D-3) 25 MCG (1000 UT) CAPS Take 1 capsule by mouth daily.    [provider]  cinacalcet (SENSIPAR) 30 MG tablet Take 1 tablet (30 mg total) by mouth 2 (two) times daily with a meal. 11/20/21   Nida, Marella Chimes, MD  CRANBERRY CONCENTRATE PO Take 1 tablet by mouth daily.    [provider]  DICLOFENAC PO Take 1 tablet by mouth 2 (two) times daily.    [provider]  DULoxetine (CYMBALTA) 20 MG capsule Take 20 mg by mouth daily as needed.    [provider]  fenofibrate 54 MG tablet Take 1 tablet by mouth daily. 04/23/20   [provider]  fluticasone (FLONASE) 50 MCG/ACT nasal spray Place 1 spray into both nostrils daily.    [provider]  fosfomycin (MONUROL) 3 g PACK Take 3 gm by mouth every 10 days for UTI prevention 11/11/21   Stoneking, Reece Leader., MD  furosemide (LASIX) 20 MG tablet Take 20 mg by mouth daily. 03/12/20   [provider]  LORazepam  (ATIVAN) 0.5 MG tablet Take 0.5 mg by mouth 3 (three) times daily as needed. 09/30/21   [provider]  losartan (COZAAR) 100 MG tablet Take 100 mg by mouth daily. 06/03/20   [provider]  mirabegron ER (MYRBETRIQ) 50 MG TB24 tablet Take 1 tablet (50 mg total) by mouth daily. 11/07/21   Stoneking, Reece Leader., MD  Multiple Vitamins-Minerals (OCUVITE PO) Take by mouth.    [provider]  olmesartan (BENICAR) 40 MG tablet Take 40 mg by mouth daily. 05/25/21   [provider]  pantoprazole (PROTONIX) 40 MG tablet Take 40 mg by mouth daily. 04/25/20   [provider]  pramipexole (MIRAPEX) 0.25 MG tablet Take 1 tablet by mouth every 12 (twelve) hours. 08/15/13   [provider]  RABEprazole (ACIPHEX) 20 MG tablet Take 20 mg by mouth daily.    [provider]  Red Yeast Rice Extract (RED YEAST RICE PO) Take 1 tablet by mouth daily.    [provider]  traMADol (ULTRAM) 50 MG tablet Take 50 mg by mouth 2 (two) times daily.    [provider]  vitamin B-12 (CYANOCOBALAMIN) 1000 MCG tablet Take 1,000 mcg by mouth daily.    [provider]      Allergies    Iodinated contrast media, Iodine, Nifedipine, Penicillins, Primidone, Codeine, Erythromycin,  Isosorbide, Peanut oil, Fish oil, Imdur [isosorbide nitrate], and Shellfish allergy    Review of Systems   Review of Systems  Physical Exam Updated Vital Signs BP (!) 181/63 (BP Location: Left Arm)   Pulse (!) 54   Temp 98.4 F (36.9 C)   Resp 16   Ht '4\' 8"'$  (1.422 m)   Wt 63 kg   SpO2 95%   BMI 31.14 kg/m  Physical Exam Vitals and nursing note reviewed.  HENT:     Head:     Comments: Abrasion/superficial laceration to right lateral periorbital area.  Steri-Strips already in place. Eyes:     Extraocular Movements: Extraocular movements intact.     Pupils: Pupils are equal, round, and reactive to light.  Cardiovascular:     Rate and Rhythm: Regular rhythm.   Chest:     Chest wall: No tenderness.  Abdominal:     Tenderness: There is no abdominal tenderness.  Musculoskeletal:        General: No tenderness.     Cervical back: Neck supple. No tenderness.  Skin:    General: Skin is warm.     Capillary Refill: Capillary refill takes less than 2 seconds.  Neurological:     Mental Status: She is alert and oriented to person, place, and time.     ED Results / Procedures / Treatments   Labs (all labs ordered are listed, but only abnormal results are displayed) Labs Reviewed  CBC WITH DIFFERENTIAL/PLATELET  BASIC METABOLIC PANEL    EKG None  Radiology CT Cervical Spine Wo Contrast  Result Date: 06/23/2022 CLINICAL DATA:  88 year old female status post fall from bed. Struck head on night stand. Right eye laceration. EXAM: CT CERVICAL SPINE WITHOUT CONTRAST TECHNIQUE: Multidetector CT imaging of the cervical spine was performed without intravenous contrast. Multiplanar CT image reconstructions were also generated. RADIATION DOSE REDUCTION: This exam was performed according to the departmental dose-optimization program which includes automated exposure control, adjustment of the mA and/or kV according to patient size and/or use of iterative reconstruction technique. COMPARISON:  Head CT today.  Cervical spine CT 12/31/2021. FINDINGS: Alignment: Stable since June. Straightening of cervical lordosis with superimposed degenerative appearing anterolisthesis at C3-C4, C6-C7. Cervicothoracic junction alignment is within normal limits. Bilateral posterior element alignment is within normal limits. Skull base and vertebrae: Visualized skull base is intact. No atlanto-occipital dissociation. C1 and C2 appear stable and intact. No acute osseous abnormality identified. Soft tissues and spinal canal: No prevertebral fluid or swelling. No visible canal hematoma. Calcified atherosclerosis, otherwise stable and negative for age noncontrast neck soft tissues. Disc  levels: Bulky and severe widespread cervical spine degeneration. Bulky posterior disc osteophyte complex at C3-C4 superimposed on chronic spondylolisthesis there. Multilevel left side facet arthropathy. Bulky disc and endplate degeneration continues C4 through C7. Bulky degenerative ligamentous hypertrophy overlying the C5 through C7 spinous processes. Chronic spinal stenosis appears stable, mild to moderate at C3-C4. Upper chest: Visible upper thoracic levels appears stable, intact. Negative lung apices with mild respiratory motion. IMPRESSION: 1. No acute traumatic injury identified in the cervical spine. 2. Widespread bulky and severe cervical spine degeneration appears stable since June. Up to moderate chronic spinal stenosis at C3-C4. Electronically Signed   By: Genevie Ann M.D.   On: 06/23/2022 07:21   CT Head Wo Contrast  Result Date: 06/23/2022 CLINICAL DATA:  88 year old female status post fall from bed. Struck head on night stand. Right eye laceration. EXAM: CT HEAD WITHOUT CONTRAST TECHNIQUE: Contiguous axial images were obtained  from the base of the skull through the vertex without intravenous contrast. RADIATION DOSE REDUCTION: This exam was performed according to the departmental dose-optimization program which includes automated exposure control, adjustment of the mA and/or kV according to patient size and/or use of iterative reconstruction technique. COMPARISON:  Head CT 12/31/2021. FINDINGS: Brain: Stable cerebral volume. No midline shift, ventriculomegaly, mass effect, evidence of mass lesion, intracranial hemorrhage or evidence of cortically based acute infarction. Stable gray-white matter differentiation throughout the brain. Mild to moderate for age patchy bilateral white matter hypodensity. No definite cortical encephalomalacia. Vascular: Calcified atherosclerosis at the skull base. No suspicious intracranial vascular hyperdensity. Skull: Chronic appearing left nasal bone fracture, stable. No  acute osseous abnormality identified. Sinuses/Orbits: Visualized paranasal sinuses and mastoids are stable and well aerated. Other: Chronic postoperative changes to both globes. No orbit or scalp soft tissue injury identified. No soft tissue gas. IMPRESSION: No acute intracranial abnormality or acute traumatic injury identified. Electronically Signed   By: Genevie Ann M.D.   On: 06/23/2022 07:17    Procedures Procedures    Medications Ordered in ED Medications  lidocaine-EPINEPHrine-tetracaine (LET) topical gel (3 mLs Topical Given 06/23/22 2979)    ED Course/ Medical Decision Making/ A&P                           Medical Decision Making  Patient with fall.  Hit head.  Mechanical fall.  Head CT and cervical spine CT done due to potential for injury.  They are reassuring.  Does have some chronic disease in the neck.  No other apparent injury.  No tenderness to extremities chest or abdomen.  At neurologic baseline.  Does have a rather superficial wound that appears to been closed by Steri-Strips appropriately. Discussed with patient's son who is at bedside.  Appears at baseline and is eager to go home.  Will discharge.  No other.  Injury at this time.       Final Clinical Impression(s) / ED Diagnoses Final diagnoses:  Fall, initial encounter  Face lacerations, initial encounter    Rx / DC Orders ED Discharge Orders     None         Davonna Belling, MD 06/23/22 [phone]

## 2022-06-30 DIAGNOSIS — R296 Repeated falls: Secondary | ICD-10-CM | POA: Diagnosis not present

## 2022-07-01 ENCOUNTER — Telehealth: Payer: Self-pay

## 2022-07-01 NOTE — Telephone Encounter (Signed)
        Patient  visited Janice [name] on 12/5    Telephone encounter attempt :  1st  A HIPAA compliant voice message was left requesting a return call.  Instructed patient to call back    [facility]  [phone] [address], [place], [place] [address] Phone: [phone] Email: [email or web address]

## 2022-07-06 DIAGNOSIS — N390 Urinary tract infection, site not specified: Secondary | ICD-10-CM | POA: Diagnosis not present

## 2022-07-06 DIAGNOSIS — M810 Age-related osteoporosis without current pathological fracture: Secondary | ICD-10-CM | POA: Diagnosis not present

## 2022-07-06 DIAGNOSIS — I10 Essential (primary) hypertension: Secondary | ICD-10-CM | POA: Diagnosis not present

## 2022-07-06 DIAGNOSIS — E876 Hypokalemia: Secondary | ICD-10-CM | POA: Diagnosis not present

## 2022-07-06 DIAGNOSIS — I872 Venous insufficiency (chronic) (peripheral): Secondary | ICD-10-CM | POA: Diagnosis not present

## 2022-07-06 DIAGNOSIS — M545 Low back pain, unspecified: Secondary | ICD-10-CM | POA: Diagnosis not present

## 2022-07-06 DIAGNOSIS — E782 Mixed hyperlipidemia: Secondary | ICD-10-CM | POA: Diagnosis not present

## 2022-07-07 DIAGNOSIS — F4323 Adjustment disorder with mixed anxiety and depressed mood: Secondary | ICD-10-CM | POA: Diagnosis not present

## 2022-07-10 DIAGNOSIS — E876 Hypokalemia: Secondary | ICD-10-CM | POA: Diagnosis not present

## 2022-07-16 DIAGNOSIS — N390 Urinary tract infection, site not specified: Secondary | ICD-10-CM | POA: Diagnosis not present

## 2022-07-16 DIAGNOSIS — F419 Anxiety disorder, unspecified: Secondary | ICD-10-CM | POA: Diagnosis not present

## 2022-07-16 DIAGNOSIS — F331 Major depressive disorder, recurrent, moderate: Secondary | ICD-10-CM | POA: Diagnosis not present

## 2022-07-21 ENCOUNTER — Emergency Department (HOSPITAL_COMMUNITY)
Admission: EM | Admit: 2022-07-21 | Discharge: 2022-07-21 | Disposition: A | Discharge status: Home / Self Care | Payer: Medicare Other | Attending: Emergency Medicine | Admitting: Emergency Medicine

## 2022-07-21 ENCOUNTER — Emergency Department (HOSPITAL_COMMUNITY): Payer: Medicare Other

## 2022-07-21 DIAGNOSIS — Z043 Encounter for examination and observation following other accident: Secondary | ICD-10-CM | POA: Diagnosis not present

## 2022-07-21 DIAGNOSIS — M4802 Spinal stenosis, cervical region: Secondary | ICD-10-CM | POA: Diagnosis not present

## 2022-07-21 DIAGNOSIS — Y92008 Other place in unspecified non-institutional (private) residence as the place of occurrence of the external cause: Secondary | ICD-10-CM | POA: Insufficient documentation

## 2022-07-21 DIAGNOSIS — S20211A Contusion of right front wall of thorax, initial encounter: Secondary | ICD-10-CM | POA: Insufficient documentation

## 2022-07-21 DIAGNOSIS — W19XXXA Unspecified fall, initial encounter: Secondary | ICD-10-CM | POA: Diagnosis not present

## 2022-07-21 DIAGNOSIS — R0781 Pleurodynia: Secondary | ICD-10-CM | POA: Diagnosis not present

## 2022-07-21 DIAGNOSIS — W0110XA Fall on same level from slipping, tripping and stumbling with subsequent striking against unspecified object, initial encounter: Secondary | ICD-10-CM | POA: Insufficient documentation

## 2022-07-21 DIAGNOSIS — S0003XA Contusion of scalp, initial encounter: Secondary | ICD-10-CM | POA: Diagnosis not present

## 2022-07-21 DIAGNOSIS — S0990XA Unspecified injury of head, initial encounter: Secondary | ICD-10-CM | POA: Diagnosis not present

## 2022-07-21 DIAGNOSIS — R58 Hemorrhage, not elsewhere classified: Secondary | ICD-10-CM | POA: Diagnosis not present

## 2022-07-21 DIAGNOSIS — I10 Essential (primary) hypertension: Secondary | ICD-10-CM | POA: Diagnosis not present

## 2022-07-21 LAB — CBC
HCT: 44.4 % (ref 36.0–46.0)
Hemoglobin: 14.6 g/dL (ref 12.0–15.0)
MCH: 30.4 pg (ref 26.0–34.0)
MCHC: 32.9 g/dL (ref 30.0–36.0)
MCV: 92.5 fL (ref 80.0–100.0)
Platelets: 286 10*3/uL (ref 150–400)
RBC: 4.8 MIL/uL (ref 3.87–5.11)
RDW: 13.2 % (ref 11.5–15.5)
WBC: 11.1 10*3/uL — ABNORMAL HIGH (ref 4.0–10.5)
nRBC: 0 % (ref 0.0–0.2)

## 2022-07-21 LAB — BASIC METABOLIC PANEL
Anion gap: 10 (ref 5–15)
BUN: 21 mg/dL (ref 8–23)
CO2: 25 mmol/L (ref 22–32)
Calcium: 9.4 mg/dL (ref 8.9–10.3)
Chloride: 104 mmol/L (ref 98–111)
Creatinine, Ser: 0.82 mg/dL (ref 0.44–1.00)
GFR, Estimated: >60 mL/min (ref >60)
Glucose, Bld: 98 mg/dL (ref 70–99)
Potassium: 4.1 mmol/L (ref 3.5–5.1)
Sodium: 139 mmol/L (ref 135–145)

## 2022-07-21 LAB — TROPONIN I (HIGH SENSITIVITY)
Troponin I (High Sensitivity): 7 ng/L (ref <18)
Troponin I (High Sensitivity): 7 ng/L (ref <18)

## 2022-07-21 MED ORDER — LIDOCAINE 5 % EX PTCH: 1.0000 | MEDICATED_PATCH | CUTANEOUS | 0 refills | Status: DC

## 2022-07-21 NOTE — ED Triage Notes (Signed)
Pt BIB [facility] from [facility] after fall while exercising. Per EMS, unknown reason for fall, pt fell backward and hit head on ground. No blood thinner. No LOC. Hematoma to back of head.

## 2022-07-21 NOTE — Discharge Instructions (Addendum)
You were seen for fall in the emergency department.  It is likely that you have a bruised rib so please use Tylenol and the lidocaine patches we have prescribed you.  Please also use the incentive spirometer to ensure you do not get a pneumonia.  Use ice for the back of your head to limit the swelling.  Follow-up with your primary doctor in 2-3 days regarding your visit.    Return immediately to the emergency department if you experience any of the following: Worsening pain, fevers, difficulty breathing, or any other concerning symptoms.    Thank you for visiting our Emergency Department. It was a pleasure taking care of you today.

## 2022-07-21 NOTE — ED Notes (Signed)
AVS provided to and discussed with patient and family member at bedside. Pt verbalizes understanding of discharge instructions and denies any questions or concerns at this time. Pt has ride home. Pt taken out of department via W/C.

## 2022-07-21 NOTE — ED Provider Notes (Signed)
[place] Provider Note   CSN: [id number] Arrival date & time: 07/21/22  1052     History  No chief complaint on file.   Janice [name] is a 89 y.o. female.  89 year old female with a history of Parkinson's and cognitive impairment who presents to the emergency department with fall.  Patient was at [facility] in [place] when she stood up in the activities room and fell backwards hitting her head.  Facility states that she did not lose consciousness.  There is no seizure-like activity.  She is on aspirin but no other blood thinners.  Staff says she has been in her usual state of health recently and did not complain of any preceding symptoms.  History limited due to baseline mental status which son believes is due to her undiagnosed dementia.  Says that at this time she is having substernal chest pressure as well as shortness of breath that just started in the emergency department.  No history of MIs.  No vomiting no diaphoresis.  Also complaining of right rib pain.       Home Medications Prior to Admission medications   Medication Sig Start Date End Date Taking? Authorizing Provider  alendronate (FOSAMAX) 70 MG tablet Take 70 mg by mouth once a week. 05/28/20  Yes [provider]  amLODipine (NORVASC) 5 MG tablet Take 5 mg by mouth daily. 07/16/22  Yes [provider]  atenolol (TENORMIN) 50 MG tablet Take 50 mg by mouth daily. 03/18/20  Yes [provider]  Cholecalciferol (VITAMIN D-3) 25 MCG (1000 UT) CAPS Take 1 capsule by mouth daily.   Yes [provider]  cinacalcet (SENSIPAR) 30 MG tablet Take 1 tablet (30 mg total) by mouth 2 (two) times daily with a meal. 11/20/21  Yes Nida, Marella Chimes, MD  CRANBERRY CONCENTRATE PO Take 1 tablet by mouth daily.   Yes [provider]  diclofenac Sodium (VOLTAREN) 1 % GEL Apply 2 g topically 2 (two) times daily.   Yes [provider]  fenofibrate 54 MG  tablet Take 1 tablet by mouth daily. 04/23/20  Yes [provider]  fluticasone (FLONASE) 50 MCG/ACT nasal spray Place 1 spray into both nostrils daily.   Yes [provider]  furosemide (LASIX) 20 MG tablet Take 20 mg by mouth daily. 03/12/20  Yes [provider]  lidocaine (LIDODERM) 5 % Place 1 patch onto the skin daily. Remove & Discard patch within 12 hours or as directed by MD 07/21/22  Yes Fransico Meadow, MD  LORazepam (ATIVAN) 0.5 MG tablet Take 0.5 mg by mouth 3 (three) times daily as needed. 09/30/21  Yes [provider]  mirabegron ER (MYRBETRIQ) 50 MG TB24 tablet Take 1 tablet (50 mg total) by mouth daily. 11/07/21  Yes Stoneking, Reece Leader., MD  Multiple Vitamins-Minerals (OCUVITE PO) Take by mouth.   Yes [provider]  olmesartan (BENICAR) 40 MG tablet Take 40 mg by mouth daily. 05/25/21  Yes [provider]  pantoprazole (PROTONIX) 40 MG tablet Take 40 mg by mouth daily. 04/25/20  Yes [provider]  Potassium Chloride ER 20 MEQ TBCR Take 1 tablet by mouth daily. 05/26/22  Yes [provider]  sertraline (ZOLOFT) 25 MG tablet Take 25 mg by mouth daily. 07/16/22  Yes [provider]  vitamin B-12 (CYANOCOBALAMIN) 1000 MCG tablet Take 1,000 mcg by mouth daily.   Yes [provider]  fosfomycin (MONUROL) 3 g PACK Take 3 gm by mouth every  10 days for UTI prevention Patient not taking: Reported on 07/21/2022 11/11/21   Primus Bravo., MD      Allergies    Iodinated contrast media, Iodine, Nifedipine, Penicillins, Primidone, Codeine, Erythromycin, Isosorbide, Peanut oil, Fish oil, Imdur [isosorbide nitrate], and Shellfish allergy    Review of Systems   Review of Systems  Physical Exam Updated Vital Signs BP (!) 177/64   Pulse 65   Temp 98 F (36.7 C) (Oral)   Resp (!) 26   Ht '4\' 8"'$  (1.422 m)   Wt 63.5 kg   SpO2 96%   BMI 31.39 kg/m  Physical Exam Constitutional:      General: She  is not in acute distress.    Appearance: Normal appearance. She is not ill-appearing.  HENT:     Head: Normocephalic.     Comments: Hematoma noted on occiput    Right Ear: External ear normal.     Left Ear: External ear normal.     Mouth/Throat:     Mouth: Mucous membranes are moist.     Pharynx: Oropharynx is clear.  Eyes:     Extraocular Movements: Extraocular movements intact.     Conjunctiva/sclera: Conjunctivae normal.     Pupils: Pupils are equal, round, and reactive to light.  Neck:     Comments: No C-spine midline tenderness to palpation Cardiovascular:     Rate and Rhythm: Normal rate and regular rhythm.     Pulses: Normal pulses.     Heart sounds: Normal heart sounds.  Pulmonary:     Effort: Pulmonary effort is normal. No respiratory distress.     Breath sounds: Normal breath sounds.  Abdominal:     General: Abdomen is flat. Bowel sounds are normal.     Palpations: Abdomen is soft.     Tenderness: There is no abdominal tenderness. There is no guarding.  Musculoskeletal:        General: No deformity. Normal range of motion.     Cervical back: No rigidity or tenderness.     Comments: No tenderness to palpation of midline thoracic or lumbar spine.  No step-offs palpated.  No tenderness to palpation of chest wall with exception of right posterior rib at approximately T10.  No bruising noted.  No tenderness to palpation of bilateral clavicles.  No tenderness to palpation, bruising, or deformities noted of bilateral shoulders, elbows, wrists, hips, knees, or ankles.  Neurological:     General: No focal deficit present.     Mental Status: She is alert. Mental status is at baseline.     Cranial Nerves: No cranial nerve deficit.     Sensory: No sensory deficit.     Motor: No weakness.     Comments: Patient oriented to self only.  Per son this is at her baseline.     ED Results / Procedures / Treatments   Labs (all labs ordered are listed, but only abnormal results are  displayed) Labs Reviewed  CBC - Abnormal; Notable for the following components:      Result Value   WBC 11.1 (*)    All other components within normal limits  BASIC METABOLIC PANEL  TROPONIN I (HIGH SENSITIVITY)  TROPONIN I (HIGH SENSITIVITY)    EKG EKG Interpretation  Date/Time:  Tuesday July 21 2022 12:19:11 EST Ventricular Rate:  62 PR Interval:  188 QRS Duration: 101 QT Interval:  411 QTC Calculation: 418 R Axis:   -15 Text Interpretation: Sinus rhythm LVH with secondary repolarization abnormality Confirmed by  Margaretmary Eddy ([id number]) on 07/21/2022 12:22:18 PM  Radiology DG Ribs Unilateral W/Chest Right  Result Date: 07/21/2022 CLINICAL DATA:  Right rib pain after fall. EXAM: RIGHT RIBS AND CHEST - 3+ VIEW COMPARISON:  December 31, 2021. FINDINGS: No fracture or other bone lesions are seen involving the ribs. There is no evidence of pneumothorax or pleural effusion. Both lungs are clear. Heart size and mediastinal contours are within normal limits. IMPRESSION: Negative. Electronically Signed   By: Marijo Conception M.D.   On: 07/21/2022 12:23   CT Head Wo Contrast  Result Date: 07/21/2022 CLINICAL DATA:  Fall EXAM: CT HEAD WITHOUT CONTRAST CT CERVICAL SPINE WITHOUT CONTRAST TECHNIQUE: Multidetector CT imaging of the head and cervical spine was performed following the standard protocol without intravenous contrast. Multiplanar CT image reconstructions of the cervical spine were also generated. RADIATION DOSE REDUCTION: This exam was performed according to the departmental dose-optimization program which includes automated exposure control, adjustment of the mA and/or kV according to patient size and/or use of iterative reconstruction technique. COMPARISON:  06/23/2022 CT head and cervical spine FINDINGS: CT HEAD FINDINGS Brain: No evidence of acute infarct, hemorrhage, mass, mass effect, or midline shift. No hydrocephalus or extra-axial fluid collection. Periventricular white matter  changes, likely the sequela of chronic small vessel ischemic disease. Age-related cerebral volume loss. Vascular: No hyperdense vessel. Atherosclerotic calcifications in the intracranial carotid and vertebral arteries. Skull: Negative for fracture or focal lesion. Large right parietal scalp hematoma. Sinuses/Orbits: Clear paranasal sinuses. Status post bilateral lens replacements. Other: The mastoid air cells are well aerated. CT CERVICAL SPINE FINDINGS Alignment: Straightening of the normal cervical lordosis with unchanged anterolisthesis at C3-C4 and C6-C7. No traumatic listhesis. Skull base and vertebrae: No acute fracture. No primary bone lesion or focal pathologic process. Soft tissues and spinal canal: No prevertebral fluid or swelling. No visible canal hematoma. Disc levels: Unchanged multilevel degenerative findings, and unchanged appearance of a bulky posterior disc osteophyte complex at C3-C4 and severe disc height loss at C5-C6 and C6-C7. Mild-to-moderate spinal canal stenosis at C3-C4 appears stable. Multilevel facet and uncovertebral hypertrophy. Upper chest: Negative. Other: None. IMPRESSION: 1. No acute intracranial process. Large right parietal scalp hematoma. 2. No acute fracture or traumatic listhesis in the cervical spine. Electronically Signed   By: Merilyn Baba M.D.   On: 07/21/2022 11:54   CT Cervical Spine Wo Contrast  Result Date: 07/21/2022 CLINICAL DATA:  Fall EXAM: CT HEAD WITHOUT CONTRAST CT CERVICAL SPINE WITHOUT CONTRAST TECHNIQUE: Multidetector CT imaging of the head and cervical spine was performed following the standard protocol without intravenous contrast. Multiplanar CT image reconstructions of the cervical spine were also generated. RADIATION DOSE REDUCTION: This exam was performed according to the departmental dose-optimization program which includes automated exposure control, adjustment of the mA and/or kV according to patient size and/or use of iterative reconstruction  technique. COMPARISON:  06/23/2022 CT head and cervical spine FINDINGS: CT HEAD FINDINGS Brain: No evidence of acute infarct, hemorrhage, mass, mass effect, or midline shift. No hydrocephalus or extra-axial fluid collection. Periventricular white matter changes, likely the sequela of chronic small vessel ischemic disease. Age-related cerebral volume loss. Vascular: No hyperdense vessel. Atherosclerotic calcifications in the intracranial carotid and vertebral arteries. Skull: Negative for fracture or focal lesion. Large right parietal scalp hematoma. Sinuses/Orbits: Clear paranasal sinuses. Status post bilateral lens replacements. Other: The mastoid air cells are well aerated. CT CERVICAL SPINE FINDINGS Alignment: Straightening of the normal cervical lordosis with unchanged anterolisthesis at C3-C4 and C6-C7. No traumatic  listhesis. Skull base and vertebrae: No acute fracture. No primary bone lesion or focal pathologic process. Soft tissues and spinal canal: No prevertebral fluid or swelling. No visible canal hematoma. Disc levels: Unchanged multilevel degenerative findings, and unchanged appearance of a bulky posterior disc osteophyte complex at C3-C4 and severe disc height loss at C5-C6 and C6-C7. Mild-to-moderate spinal canal stenosis at C3-C4 appears stable. Multilevel facet and uncovertebral hypertrophy. Upper chest: Negative. Other: None. IMPRESSION: 1. No acute intracranial process. Large right parietal scalp hematoma. 2. No acute fracture or traumatic listhesis in the cervical spine. Electronically Signed   By: Merilyn Baba M.D.   On: 07/21/2022 11:54    Procedures Procedures   Medications Ordered in ED Medications - No data to display  ED Course/ Medical Decision Making/ A&P                           Medical Decision Making Amount and/or Complexity of Data Reviewed Labs: ordered. Radiology: ordered.  Risk Prescription drug management.   Janice [name] is a 89 y.o. female with  comorbidities that complicate the patient evaluation including Parkinson's and cognitive impairment who presents emergency department with head trauma after a fall.  Also complaining of shortness of breath and chest discomfort in the emergency department.  Initial Ddx:  Mechanical fall, ICH, C-spine injury, rib fracture, MI, pneumothorax  MDM:  Feel the patient likely had a mechanical fall initially that led to her head trauma.  With her age will obtain CT head and C-spine.  Will also obtain x-ray rib series to evaluate for possible rib fracture though feel that this is less likely.  Patient was complaining of transient chest pressure and shortness of breath in the emergency department so we will obtain EKG and troponins to evaluate for MI that could have been precipitated from pain from her fall.  Will also obtain x-ray to evaluate for pneumothorax.  Plan:  Labs Troponin CT head CT C-spine Rib series right  ED Summary/Re-evaluation:  Patient underwent the following workup and did not reveal any traumatic injuries.  Suspect that she has a bruised rib on the right so was instructed to take Tylenol and use lidocaine patch.  Was given an incentive spirometer prior to discharge.  To his of her cardiac workup, serial troponins and EKG were unremarkable.  Feel that MI highly unlikely and that this may have been anxiety due to pain of her fall.  Instructed her to follow-up with her primary doctor in 2 to 3 days.  This patient presents to the ED for concern of complaints listed in HPI, this involves an extensive number of treatment options, and is a complaint that carries with it a high risk of complications and morbidity. Disposition including potential need for admission considered.   Dispo: DC Home. Return precautions discussed including, but not limited to, those listed in the AVS. Allowed pt time to ask questions which were answered fully prior to dc.  Additional history obtained from  son Records reviewed Outpatient Clinic Notes The following labs were independently interpreted: Serial Troponins and show no acute abnormality I independently reviewed the following imaging with scope of interpretation limited to determining acute life threatening conditions related to emergency care: Chest x-ray and agree with the radiologist interpretation with the following exceptions: None I personally reviewed and interpreted cardiac monitoring: normal sinus rhythm  I personally reviewed and interpreted the pt's EKG: see above for interpretation  I have reviewed the patients  home medications and made adjustments as needed Social Determinants of health:  Elderly SNF resident  Final Clinical Impression(s) / ED Diagnoses Final diagnoses:  Fall, initial encounter  Minor head injury, initial encounter  Contusion of rib on right side, initial encounter    Rx / DC Orders ED Discharge Orders          Ordered    lidocaine (LIDODERM) 5 %  Every 24 hours        07/21/22 1714              Fransico Meadow, MD 07/21/22 1824

## 2022-07-22 DIAGNOSIS — F419 Anxiety disorder, unspecified: Secondary | ICD-10-CM | POA: Diagnosis not present

## 2022-07-22 DIAGNOSIS — F0394 Unspecified dementia, unspecified severity, with anxiety: Secondary | ICD-10-CM | POA: Diagnosis not present

## 2022-07-22 DIAGNOSIS — F331 Major depressive disorder, recurrent, moderate: Secondary | ICD-10-CM | POA: Diagnosis not present

## 2022-07-23 ENCOUNTER — Telehealth: Payer: Self-pay

## 2022-07-23 NOTE — Telephone Encounter (Signed)
        Patient  visited [place] on 1/2   Telephone encounter attempt :  1st  A HIPAA compliant voice message was left requesting a return call.  Instructed patient to call back .    [facility] [facility], Care Management  [phone] [address], [place], [place] [address] Phone: [phone] Email: [email or web address]

## 2022-07-24 ENCOUNTER — Telehealth: Payer: Self-pay

## 2022-07-24 NOTE — Telephone Encounter (Signed)
        Patient  visited [place] on 1/2  Telephone encounter attempt :  2nd  A HIPAA compliant voice message was left requesting a return call.  Instructed patient to call back .    [facility], Care Management  [phone] [address], [place], [place] [address] Phone: [phone] Email: [email or web address]

## 2022-07-28 DIAGNOSIS — F419 Anxiety disorder, unspecified: Secondary | ICD-10-CM | POA: Diagnosis not present

## 2022-07-28 DIAGNOSIS — S0003XA Contusion of scalp, initial encounter: Secondary | ICD-10-CM | POA: Diagnosis not present

## 2022-07-28 DIAGNOSIS — F331 Major depressive disorder, recurrent, moderate: Secondary | ICD-10-CM | POA: Diagnosis not present

## 2022-07-28 DIAGNOSIS — R296 Repeated falls: Secondary | ICD-10-CM | POA: Diagnosis not present

## 2022-08-04 DIAGNOSIS — E876 Hypokalemia: Secondary | ICD-10-CM | POA: Diagnosis not present

## 2022-08-04 DIAGNOSIS — S0003XA Contusion of scalp, initial encounter: Secondary | ICD-10-CM | POA: Diagnosis not present

## 2022-08-04 DIAGNOSIS — N390 Urinary tract infection, site not specified: Secondary | ICD-10-CM | POA: Diagnosis not present

## 2022-08-04 DIAGNOSIS — Z8616 Personal history of COVID-19: Secondary | ICD-10-CM | POA: Diagnosis not present

## 2022-08-04 DIAGNOSIS — I872 Venous insufficiency (chronic) (peripheral): Secondary | ICD-10-CM | POA: Diagnosis not present

## 2022-08-04 DIAGNOSIS — M810 Age-related osteoporosis without current pathological fracture: Secondary | ICD-10-CM | POA: Diagnosis not present

## 2022-08-04 DIAGNOSIS — E782 Mixed hyperlipidemia: Secondary | ICD-10-CM | POA: Diagnosis not present

## 2022-08-04 DIAGNOSIS — M545 Low back pain, unspecified: Secondary | ICD-10-CM | POA: Diagnosis not present

## 2022-08-04 DIAGNOSIS — I10 Essential (primary) hypertension: Secondary | ICD-10-CM | POA: Diagnosis not present

## 2022-08-05 DIAGNOSIS — F419 Anxiety disorder, unspecified: Secondary | ICD-10-CM | POA: Diagnosis not present

## 2022-08-05 DIAGNOSIS — F331 Major depressive disorder, recurrent, moderate: Secondary | ICD-10-CM | POA: Diagnosis not present

## 2022-08-07 DIAGNOSIS — N390 Urinary tract infection, site not specified: Secondary | ICD-10-CM | POA: Diagnosis not present

## 2022-08-18 DIAGNOSIS — F331 Major depressive disorder, recurrent, moderate: Secondary | ICD-10-CM | POA: Diagnosis not present

## 2022-08-25 DIAGNOSIS — F331 Major depressive disorder, recurrent, moderate: Secondary | ICD-10-CM | POA: Diagnosis not present

## 2022-08-26 DIAGNOSIS — F0394 Unspecified dementia, unspecified severity, with anxiety: Secondary | ICD-10-CM | POA: Diagnosis not present

## 2022-08-26 DIAGNOSIS — F419 Anxiety disorder, unspecified: Secondary | ICD-10-CM | POA: Diagnosis not present

## 2022-08-26 DIAGNOSIS — F331 Major depressive disorder, recurrent, moderate: Secondary | ICD-10-CM | POA: Diagnosis not present

## 2022-09-01 DIAGNOSIS — E782 Mixed hyperlipidemia: Secondary | ICD-10-CM | POA: Diagnosis not present

## 2022-09-01 DIAGNOSIS — I10 Essential (primary) hypertension: Secondary | ICD-10-CM | POA: Diagnosis not present

## 2022-09-01 DIAGNOSIS — I872 Venous insufficiency (chronic) (peripheral): Secondary | ICD-10-CM | POA: Diagnosis not present

## 2022-09-01 DIAGNOSIS — E876 Hypokalemia: Secondary | ICD-10-CM | POA: Diagnosis not present

## 2022-09-01 DIAGNOSIS — M810 Age-related osteoporosis without current pathological fracture: Secondary | ICD-10-CM | POA: Diagnosis not present

## 2022-09-01 DIAGNOSIS — F419 Anxiety disorder, unspecified: Secondary | ICD-10-CM | POA: Diagnosis not present

## 2022-09-01 DIAGNOSIS — N390 Urinary tract infection, site not specified: Secondary | ICD-10-CM | POA: Diagnosis not present

## 2022-09-01 DIAGNOSIS — F331 Major depressive disorder, recurrent, moderate: Secondary | ICD-10-CM | POA: Diagnosis not present

## 2022-09-01 DIAGNOSIS — M545 Low back pain, unspecified: Secondary | ICD-10-CM | POA: Diagnosis not present

## 2022-09-08 DIAGNOSIS — M79674 Pain in right toe(s): Secondary | ICD-10-CM | POA: Diagnosis not present

## 2022-09-08 DIAGNOSIS — F331 Major depressive disorder, recurrent, moderate: Secondary | ICD-10-CM | POA: Diagnosis not present

## 2022-09-08 DIAGNOSIS — F419 Anxiety disorder, unspecified: Secondary | ICD-10-CM | POA: Diagnosis not present

## 2022-09-08 DIAGNOSIS — M79675 Pain in left toe(s): Secondary | ICD-10-CM | POA: Diagnosis not present

## 2022-09-08 DIAGNOSIS — B351 Tinea unguium: Secondary | ICD-10-CM | POA: Diagnosis not present

## 2022-09-09 DIAGNOSIS — N390 Urinary tract infection, site not specified: Secondary | ICD-10-CM | POA: Diagnosis not present

## 2022-09-11 DIAGNOSIS — E782 Mixed hyperlipidemia: Secondary | ICD-10-CM | POA: Diagnosis not present

## 2022-09-11 DIAGNOSIS — M810 Age-related osteoporosis without current pathological fracture: Secondary | ICD-10-CM | POA: Diagnosis not present

## 2022-09-11 DIAGNOSIS — I10 Essential (primary) hypertension: Secondary | ICD-10-CM | POA: Diagnosis not present

## 2022-09-11 DIAGNOSIS — R197 Diarrhea, unspecified: Secondary | ICD-10-CM | POA: Diagnosis not present

## 2022-09-11 DIAGNOSIS — I872 Venous insufficiency (chronic) (peripheral): Secondary | ICD-10-CM | POA: Diagnosis not present

## 2022-09-15 DIAGNOSIS — F331 Major depressive disorder, recurrent, moderate: Secondary | ICD-10-CM | POA: Diagnosis not present

## 2022-09-15 DIAGNOSIS — F419 Anxiety disorder, unspecified: Secondary | ICD-10-CM | POA: Diagnosis not present

## 2022-09-21 ENCOUNTER — Telehealth: Payer: Self-pay | Admitting: "Endocrinology

## 2022-09-21 DIAGNOSIS — E210 Primary hyperparathyroidism: Secondary | ICD-10-CM

## 2022-09-21 NOTE — Telephone Encounter (Signed)
Pt needs labs updated

## 2022-09-21 NOTE — Telephone Encounter (Signed)
Labs updated and sent to [facility]. 

## 2022-09-22 ENCOUNTER — Ambulatory Visit: Payer: Medicare Other | Admitting: "Endocrinology

## 2022-09-22 DIAGNOSIS — F419 Anxiety disorder, unspecified: Secondary | ICD-10-CM | POA: Diagnosis not present

## 2022-09-22 DIAGNOSIS — F331 Major depressive disorder, recurrent, moderate: Secondary | ICD-10-CM | POA: Diagnosis not present

## 2022-09-23 DIAGNOSIS — F0394 Unspecified dementia, unspecified severity, with anxiety: Secondary | ICD-10-CM | POA: Diagnosis not present

## 2022-09-23 DIAGNOSIS — F419 Anxiety disorder, unspecified: Secondary | ICD-10-CM | POA: Diagnosis not present

## 2022-09-23 DIAGNOSIS — F331 Major depressive disorder, recurrent, moderate: Secondary | ICD-10-CM | POA: Diagnosis not present

## 2022-09-29 DIAGNOSIS — F419 Anxiety disorder, unspecified: Secondary | ICD-10-CM | POA: Diagnosis not present

## 2022-09-29 DIAGNOSIS — F331 Major depressive disorder, recurrent, moderate: Secondary | ICD-10-CM | POA: Diagnosis not present

## 2022-10-06 DIAGNOSIS — F331 Major depressive disorder, recurrent, moderate: Secondary | ICD-10-CM | POA: Diagnosis not present

## 2022-10-06 DIAGNOSIS — E876 Hypokalemia: Secondary | ICD-10-CM | POA: Diagnosis not present

## 2022-10-06 DIAGNOSIS — I872 Venous insufficiency (chronic) (peripheral): Secondary | ICD-10-CM | POA: Diagnosis not present

## 2022-10-06 DIAGNOSIS — I10 Essential (primary) hypertension: Secondary | ICD-10-CM | POA: Diagnosis not present

## 2022-10-06 DIAGNOSIS — F419 Anxiety disorder, unspecified: Secondary | ICD-10-CM | POA: Diagnosis not present

## 2022-10-06 DIAGNOSIS — M545 Low back pain, unspecified: Secondary | ICD-10-CM | POA: Diagnosis not present

## 2022-10-06 DIAGNOSIS — M810 Age-related osteoporosis without current pathological fracture: Secondary | ICD-10-CM | POA: Diagnosis not present

## 2022-10-06 DIAGNOSIS — E782 Mixed hyperlipidemia: Secondary | ICD-10-CM | POA: Diagnosis not present

## 2022-10-15 DIAGNOSIS — F331 Major depressive disorder, recurrent, moderate: Secondary | ICD-10-CM | POA: Diagnosis not present

## 2022-10-15 DIAGNOSIS — F419 Anxiety disorder, unspecified: Secondary | ICD-10-CM | POA: Diagnosis not present

## 2022-10-16 DIAGNOSIS — Z79899 Other long term (current) drug therapy: Secondary | ICD-10-CM | POA: Diagnosis not present

## 2022-10-20 DIAGNOSIS — F331 Major depressive disorder, recurrent, moderate: Secondary | ICD-10-CM | POA: Diagnosis not present

## 2022-10-20 DIAGNOSIS — F419 Anxiety disorder, unspecified: Secondary | ICD-10-CM | POA: Diagnosis not present

## 2022-10-20 DIAGNOSIS — E210 Primary hyperparathyroidism: Secondary | ICD-10-CM | POA: Diagnosis not present

## 2022-10-21 LAB — PTH, INTACT AND CALCIUM
Calcium: 8.5 mg/dL — ABNORMAL LOW (ref 8.7–10.3)
PTH: 47 pg/mL (ref 15–65)

## 2022-10-22 DIAGNOSIS — F419 Anxiety disorder, unspecified: Secondary | ICD-10-CM | POA: Diagnosis not present

## 2022-10-22 DIAGNOSIS — F0394 Unspecified dementia, unspecified severity, with anxiety: Secondary | ICD-10-CM | POA: Diagnosis not present

## 2022-10-22 DIAGNOSIS — F331 Major depressive disorder, recurrent, moderate: Secondary | ICD-10-CM | POA: Diagnosis not present

## 2022-10-26 DIAGNOSIS — Z043 Encounter for examination and observation following other accident: Secondary | ICD-10-CM | POA: Diagnosis not present

## 2022-10-26 DIAGNOSIS — G319 Degenerative disease of nervous system, unspecified: Secondary | ICD-10-CM | POA: Diagnosis not present

## 2022-10-26 DIAGNOSIS — M19011 Primary osteoarthritis, right shoulder: Secondary | ICD-10-CM | POA: Diagnosis not present

## 2022-10-26 DIAGNOSIS — S40011A Contusion of right shoulder, initial encounter: Secondary | ICD-10-CM | POA: Diagnosis not present

## 2022-10-26 DIAGNOSIS — S0990XA Unspecified injury of head, initial encounter: Secondary | ICD-10-CM | POA: Diagnosis not present

## 2022-10-26 DIAGNOSIS — W01198A Fall on same level from slipping, tripping and stumbling with subsequent striking against other object, initial encounter: Secondary | ICD-10-CM | POA: Diagnosis not present

## 2022-10-26 DIAGNOSIS — E785 Hyperlipidemia, unspecified: Secondary | ICD-10-CM | POA: Diagnosis not present

## 2022-10-26 DIAGNOSIS — I10 Essential (primary) hypertension: Secondary | ICD-10-CM | POA: Diagnosis not present

## 2022-10-27 ENCOUNTER — Encounter: Payer: Self-pay | Admitting: "Endocrinology

## 2022-10-27 ENCOUNTER — Ambulatory Visit (INDEPENDENT_AMBULATORY_CARE_PROVIDER_SITE_OTHER): Payer: Medicare Other | Admitting: "Endocrinology

## 2022-10-27 ENCOUNTER — Telehealth: Payer: Self-pay | Admitting: "Endocrinology

## 2022-10-27 VITALS — BP 136/68 | HR 64 | Ht <= 58 in | Wt 140.8 lb

## 2022-10-27 DIAGNOSIS — R296 Repeated falls: Secondary | ICD-10-CM | POA: Diagnosis not present

## 2022-10-27 DIAGNOSIS — E210 Primary hyperparathyroidism: Secondary | ICD-10-CM | POA: Diagnosis not present

## 2022-10-27 DIAGNOSIS — M545 Low back pain, unspecified: Secondary | ICD-10-CM | POA: Diagnosis not present

## 2022-10-27 DIAGNOSIS — I10 Essential (primary) hypertension: Secondary | ICD-10-CM | POA: Diagnosis not present

## 2022-10-27 DIAGNOSIS — F331 Major depressive disorder, recurrent, moderate: Secondary | ICD-10-CM | POA: Diagnosis not present

## 2022-10-27 DIAGNOSIS — M810 Age-related osteoporosis without current pathological fracture: Secondary | ICD-10-CM | POA: Diagnosis not present

## 2022-10-27 DIAGNOSIS — F419 Anxiety disorder, unspecified: Secondary | ICD-10-CM | POA: Diagnosis not present

## 2022-10-27 DIAGNOSIS — E782 Mixed hyperlipidemia: Secondary | ICD-10-CM | POA: Diagnosis not present

## 2022-10-27 DIAGNOSIS — E876 Hypokalemia: Secondary | ICD-10-CM | POA: Diagnosis not present

## 2022-10-27 DIAGNOSIS — I872 Venous insufficiency (chronic) (peripheral): Secondary | ICD-10-CM | POA: Diagnosis not present

## 2022-10-27 MED ORDER — CINACALCET HCL 30 MG PO TABS: 30.0000 mg | ORAL_TABLET | Freq: Every day | ORAL | 1 refills | Status: DC

## 2022-10-27 NOTE — Telephone Encounter (Signed)
Faxed order to [facility]

## 2022-10-27 NOTE — Progress Notes (Signed)
Endocrinology follow-up note    10/27/2022, 1:14 PM  Janice [name] is a 89 y.o.-year-old female, being seen in follow-up for hypercalcemia/hyperparathyroidism. PMD: Toma Deiters, MD .   Past Medical History:  Diagnosis Date   Age-related osteoporosis without current pathological fracture    Essential hypertension    Hypercalcemia    Low back pain    Mixed hyperlipidemia     Past Surgical History:  Procedure Laterality Date   COLONOSCOPY     GALLBLADDER SURGERY     December 2021    Social History   Tobacco Use   Smoking status: Never  Vaping Use   Vaping Use: Never used  Substance Use Topics   Alcohol use: Never   Drug use: Never    Family History  Problem Relation Age of Onset   AAA (abdominal aortic aneurysm) Mother    CAD Father     Outpatient Encounter Medications as of 10/27/2022  Medication Sig   alendronate (FOSAMAX) 70 MG tablet Take 70 mg by mouth once a week.   amLODipine (NORVASC) 5 MG tablet Take 5 mg by mouth daily.   atenolol (TENORMIN) 50 MG tablet Take 50 mg by mouth daily.   Cholecalciferol (VITAMIN D-3) 25 MCG (1000 UT) CAPS Take 1 capsule by mouth daily.   cinacalcet (SENSIPAR) 30 MG tablet Take 1 tablet (30 mg total) by mouth daily with breakfast.   CRANBERRY CONCENTRATE PO Take 1 tablet by mouth daily.   diclofenac Sodium (VOLTAREN) 1 % GEL Apply 2 g topically 2 (two) times daily.   fenofibrate 54 MG tablet Take 1 tablet by mouth daily.   fluticasone (FLONASE) 50 MCG/ACT nasal spray Place 1 spray into both nostrils daily.   fosfomycin (MONUROL) 3 g PACK Take 3 gm by mouth every 10 days for UTI prevention (Patient not taking: Reported on 07/21/2022)   furosemide (LASIX) 20 MG tablet Take 20 mg by mouth daily.   lidocaine (LIDODERM) 5 % Place 1 patch onto the skin daily. Remove & Discard patch within 12 hours or as directed by MD   LORazepam (ATIVAN) 0.5 MG tablet Take  0.5 mg by mouth 3 (three) times daily as needed.   mirabegron ER (MYRBETRIQ) 50 MG TB24 tablet Take 1 tablet (50 mg total) by mouth daily.   Multiple Vitamins-Minerals (OCUVITE PO) Take by mouth.   olmesartan (BENICAR) 40 MG tablet Take 40 mg by mouth daily.   pantoprazole (PROTONIX) 40 MG tablet Take 40 mg by mouth daily.   Potassium Chloride ER 20 MEQ TBCR Take 1 tablet by mouth daily.   sertraline (ZOLOFT) 25 MG tablet Take 25 mg by mouth daily.   vitamin B-12 (CYANOCOBALAMIN) 1000 MCG tablet Take 1,000 mcg by mouth daily.   [DISCONTINUED] cinacalcet (SENSIPAR) 30 MG tablet Take 1 tablet (30 mg total) by mouth 2 (two) times daily with a meal.   No facility-administered encounter medications on file as of 10/27/2022.    Allergies  Allergen Reactions   Iodinated Contrast Media Swelling    Other reaction(s): Hypotension, Other (see comments) Other reaction(s): Hypotension Other reaction(s): Hypotension    Iodine Swelling  Other reaction(s): Abdominal Pain, Other (see comments) Other reaction(s): Abdominal Pain Other reaction(s): Abdominal Pain    Nifedipine Swelling    Other reaction(s): Other (see comments) Blood vessels swelled large in legs Blood vessels swelled large in legs Blood vessels swelled large in legs Blood vessels swelled large in legs    Penicillins Hives and Rash   Primidone     Other reaction(s): Hypertension, Other (see comments) Stroke type symptoms, slurred speech, weakness Other reaction(s): Hypertension Stroke type symptoms, slurred speech, weakness Stroke type symptoms, slurred speech, weakness Other reaction(s): Hypertension Stroke type symptoms, slurred speech, weakness    Codeine Nausea And Vomiting   Erythromycin Nausea And Vomiting and Rash   Isosorbide     Other reaction(s): Other (see comments) Not sure Not sure Not sure Not sure    Peanut Oil     Other reaction(s): Other (see comments) Stomach cramps Stomach cramps Stomach  cramps Stomach cramps    Fish Oil    Imdur [Isosorbide Nitrate]    Shellfish Allergy      HPI  Janice [name] was diagnosed with hypercalcemia in September 2021.  Her previsit labs are consistent with hypercalcemia due to primary hyperparathyroidism.    Patient was diagnosed with primary hyperparathyroidism, not a surgical candidate.  She was started on Sensipar 30 mg p.o. twice daily.   she is accompanied by her aide to clinic.  Her previsit labs show controlled calcium at 8.5 improving from 11.2.  Her PTH is 47 improving from  144.  She has osteoporosis , DXA not repeated since 2022. she is on alendronate 70 mg p.o. weekly.   She has history of wrist fracture, scoliosis with loss of height.  She is assisted by her son Janice [name] during this visit.    No history of  kidney stones.  No history of CKD.  she is not on HCTZ or other thiazide therapy.  Her vitamin D status is not known. she is not on calcium supplements,  she eats dairy and green, leafy, vegetables on average amounts.  she does not have a family history of hypercalcemia, pituitary tumors, thyroid cancer, or osteoporosis.  -She walks with a walker due to disequilibrium.   ROS:  PE: BP 136/68 Comment: L arm with manuel cuff  Pulse 64   Ht 4\' 8"  (1.422 m)   Wt 140 lb 12.8 oz (63.9 kg)   BMI 31.57 kg/m , Body mass index is 31.57 kg/m. Wt Readings from Last 3 Encounters:  10/27/22 140 lb 12.8 oz (63.9 kg)  07/21/22 140 lb (63.5 kg)  06/23/22 138 lb 14.2 oz (63 kg)     April 18, 2020: Labs show calcium of 11.4  Recent Results (from the past 2160 hour(s))  PTH, intact and calcium     Status: Abnormal   Collection Time: 10/20/22  9:50 AM  Result Value Ref Range   Calcium 8.5 (L) 8.7 - 10.3 mg/dL   PTH 47 15 - 65 pg/mL   PTH Interp Comment     Comment: Interpretation                 Intact PTH    Calcium                                 (pg/mL)      (mg/dL) Normal  15 - 65      8.6 - 10.2 Primary Hyperparathyroidism         >65          >10.2 Secondary Hyperparathyroidism       >65          <10.2 Non-Parathyroid Hypercalcemia       <65          >10.2 Hypoparathyroidism                  <15          < 8.6 Non-Parathyroid Hypocalcemia    15 - 65          < 8.6     Assessment: 1. Hypercalcemia /hyperparathyroidism 2.  Osteoporosis  Plan: She has hypercalcemia from primary hyperparathyroidism, not a surgical candidate.  She has responded to Sensipar intervention.  She is advised to decrease  Sensipar 30 mg to once daily at breakfast with plant to repeat PTH/calcium in 6 months.     -She has medical history of osteoporosis on treatment.  She denies any history of nephrolithiasis.  She has taken alendronate reportedly since her 8870s.  She may be considered for drug holiday after her next bone density.  I recommended her bone DXA repeated on the [facility]. She is encouraged to ambulate safely ,always with her walker.  She is advised to continue follow-up with her PMD.  I spent  21  minutes in the care of the patient today including review of labs from Thyroid Function, CMP, and other relevant labs ; imaging/biopsy records (current and previous including abstractions from other facilities); face-to-face time discussing  her lab results and symptoms, medications doses, her options of short and long term treatment based on the latest standards of care / guidelines;   and documenting the encounter.  Bernarda CaffeyVirginia Leake Mennenga  participated in the discussions, expressed understanding, and voiced agreement with the above plans.  All questions were answered to her satisfaction. she is encouraged to contact clinic should she have any questions or concerns prior to her return visit.   - Return in about 6 months (around 04/28/2023) for Fasting Labs  in AM B4 8, DXA Scan B4 NV.   Marquis LunchGebre Tacuma Graffam, MD [facility] [address] [place], [place] [address] Phone: [phone]  Fax: [phone]    This note was partially dictated with voice recognition software. Similar sounding words can be transcribed inadequately or may not  be corrected upon review.  10/27/2022, 1:14 PM

## 2022-10-27 NOTE — Telephone Encounter (Signed)
Pt needs a dxa scan

## 2022-10-28 ENCOUNTER — Encounter: Payer: Self-pay | Admitting: Physician Assistant

## 2022-10-29 DIAGNOSIS — I10 Essential (primary) hypertension: Secondary | ICD-10-CM | POA: Diagnosis not present

## 2022-10-29 DIAGNOSIS — M545 Low back pain, unspecified: Secondary | ICD-10-CM | POA: Diagnosis not present

## 2022-10-29 DIAGNOSIS — E782 Mixed hyperlipidemia: Secondary | ICD-10-CM | POA: Diagnosis not present

## 2022-10-29 DIAGNOSIS — I872 Venous insufficiency (chronic) (peripheral): Secondary | ICD-10-CM | POA: Diagnosis not present

## 2022-10-29 DIAGNOSIS — M810 Age-related osteoporosis without current pathological fracture: Secondary | ICD-10-CM | POA: Diagnosis not present

## 2022-10-29 DIAGNOSIS — E876 Hypokalemia: Secondary | ICD-10-CM | POA: Diagnosis not present

## 2022-10-29 DIAGNOSIS — Z8616 Personal history of COVID-19: Secondary | ICD-10-CM | POA: Diagnosis not present

## 2022-10-29 DIAGNOSIS — R296 Repeated falls: Secondary | ICD-10-CM | POA: Diagnosis not present

## 2022-11-03 DIAGNOSIS — M810 Age-related osteoporosis without current pathological fracture: Secondary | ICD-10-CM | POA: Diagnosis not present

## 2022-11-03 DIAGNOSIS — E782 Mixed hyperlipidemia: Secondary | ICD-10-CM | POA: Diagnosis not present

## 2022-11-03 DIAGNOSIS — F331 Major depressive disorder, recurrent, moderate: Secondary | ICD-10-CM | POA: Diagnosis not present

## 2022-11-03 DIAGNOSIS — I872 Venous insufficiency (chronic) (peripheral): Secondary | ICD-10-CM | POA: Diagnosis not present

## 2022-11-03 DIAGNOSIS — F419 Anxiety disorder, unspecified: Secondary | ICD-10-CM | POA: Diagnosis not present

## 2022-11-03 DIAGNOSIS — E876 Hypokalemia: Secondary | ICD-10-CM | POA: Diagnosis not present

## 2022-11-03 DIAGNOSIS — M545 Low back pain, unspecified: Secondary | ICD-10-CM | POA: Diagnosis not present

## 2022-11-03 DIAGNOSIS — I10 Essential (primary) hypertension: Secondary | ICD-10-CM | POA: Diagnosis not present

## 2022-11-06 DIAGNOSIS — E876 Hypokalemia: Secondary | ICD-10-CM | POA: Diagnosis not present

## 2022-11-06 DIAGNOSIS — M810 Age-related osteoporosis without current pathological fracture: Secondary | ICD-10-CM | POA: Diagnosis not present

## 2022-11-06 DIAGNOSIS — I872 Venous insufficiency (chronic) (peripheral): Secondary | ICD-10-CM | POA: Diagnosis not present

## 2022-11-06 DIAGNOSIS — I10 Essential (primary) hypertension: Secondary | ICD-10-CM | POA: Diagnosis not present

## 2022-11-06 DIAGNOSIS — E782 Mixed hyperlipidemia: Secondary | ICD-10-CM | POA: Diagnosis not present

## 2022-11-06 DIAGNOSIS — M545 Low back pain, unspecified: Secondary | ICD-10-CM | POA: Diagnosis not present

## 2022-11-09 DIAGNOSIS — I10 Essential (primary) hypertension: Secondary | ICD-10-CM | POA: Diagnosis not present

## 2022-11-09 DIAGNOSIS — M810 Age-related osteoporosis without current pathological fracture: Secondary | ICD-10-CM | POA: Diagnosis not present

## 2022-11-09 DIAGNOSIS — I872 Venous insufficiency (chronic) (peripheral): Secondary | ICD-10-CM | POA: Diagnosis not present

## 2022-11-09 DIAGNOSIS — M545 Low back pain, unspecified: Secondary | ICD-10-CM | POA: Diagnosis not present

## 2022-11-09 DIAGNOSIS — E876 Hypokalemia: Secondary | ICD-10-CM | POA: Diagnosis not present

## 2022-11-09 DIAGNOSIS — E782 Mixed hyperlipidemia: Secondary | ICD-10-CM | POA: Diagnosis not present

## 2022-11-10 DIAGNOSIS — F419 Anxiety disorder, unspecified: Secondary | ICD-10-CM | POA: Diagnosis not present

## 2022-11-10 DIAGNOSIS — F331 Major depressive disorder, recurrent, moderate: Secondary | ICD-10-CM | POA: Diagnosis not present

## 2022-11-13 DIAGNOSIS — M810 Age-related osteoporosis without current pathological fracture: Secondary | ICD-10-CM | POA: Diagnosis not present

## 2022-11-13 DIAGNOSIS — I872 Venous insufficiency (chronic) (peripheral): Secondary | ICD-10-CM | POA: Diagnosis not present

## 2022-11-13 DIAGNOSIS — E876 Hypokalemia: Secondary | ICD-10-CM | POA: Diagnosis not present

## 2022-11-13 DIAGNOSIS — E782 Mixed hyperlipidemia: Secondary | ICD-10-CM | POA: Diagnosis not present

## 2022-11-13 DIAGNOSIS — I10 Essential (primary) hypertension: Secondary | ICD-10-CM | POA: Diagnosis not present

## 2022-11-13 DIAGNOSIS — M545 Low back pain, unspecified: Secondary | ICD-10-CM | POA: Diagnosis not present

## 2022-11-14 ENCOUNTER — Other Ambulatory Visit: Payer: Self-pay

## 2022-11-14 ENCOUNTER — Emergency Department (HOSPITAL_COMMUNITY): Payer: Medicare Other

## 2022-11-14 ENCOUNTER — Encounter (HOSPITAL_COMMUNITY): Payer: Self-pay | Admitting: Emergency Medicine

## 2022-11-14 ENCOUNTER — Emergency Department (HOSPITAL_COMMUNITY)
Admission: EM | Admit: 2022-11-14 | Discharge: 2022-11-14 | Disposition: A | Discharge status: Skilled Nursing Facility | Payer: Medicare Other | Attending: Emergency Medicine | Admitting: Emergency Medicine

## 2022-11-14 DIAGNOSIS — W19XXXA Unspecified fall, initial encounter: Secondary | ICD-10-CM | POA: Insufficient documentation

## 2022-11-14 DIAGNOSIS — I10 Essential (primary) hypertension: Secondary | ICD-10-CM | POA: Diagnosis not present

## 2022-11-14 DIAGNOSIS — R450 Nervousness: Secondary | ICD-10-CM | POA: Diagnosis not present

## 2022-11-14 DIAGNOSIS — Z043 Encounter for examination and observation following other accident: Secondary | ICD-10-CM | POA: Diagnosis not present

## 2022-11-14 DIAGNOSIS — F0390 Unspecified dementia without behavioral disturbance: Secondary | ICD-10-CM | POA: Insufficient documentation

## 2022-11-14 DIAGNOSIS — R1110 Vomiting, unspecified: Secondary | ICD-10-CM | POA: Diagnosis not present

## 2022-11-14 DIAGNOSIS — Z7401 Bed confinement status: Secondary | ICD-10-CM | POA: Diagnosis not present

## 2022-11-14 DIAGNOSIS — S0990XA Unspecified injury of head, initial encounter: Secondary | ICD-10-CM | POA: Diagnosis not present

## 2022-11-14 DIAGNOSIS — Y92129 Unspecified place in nursing home as the place of occurrence of the external cause: Secondary | ICD-10-CM | POA: Diagnosis not present

## 2022-11-14 DIAGNOSIS — R7309 Other abnormal glucose: Secondary | ICD-10-CM | POA: Insufficient documentation

## 2022-11-14 DIAGNOSIS — R110 Nausea: Secondary | ICD-10-CM | POA: Diagnosis not present

## 2022-11-14 DIAGNOSIS — I959 Hypotension, unspecified: Secondary | ICD-10-CM | POA: Diagnosis not present

## 2022-11-14 DIAGNOSIS — M549 Dorsalgia, unspecified: Secondary | ICD-10-CM | POA: Diagnosis not present

## 2022-11-14 HISTORY — DX: Venous insufficiency (chronic) (peripheral): I87.2

## 2022-11-14 LAB — CBG MONITORING, ED: Glucose-Capillary: 129 mg/dL — ABNORMAL HIGH (ref 70–99)

## 2022-11-14 NOTE — ED Provider Notes (Signed)
AP-EMERGENCY DEPT [facility] Emergency Department Provider Note MRN:  [id number]  Arrival date & time: 11/14/22     Chief Complaint   Fall History of Present Illness   Janice [name] is a 89 y.o. year-old female with a history of dementia presenting to the ED with chief complaint of fall.  Unwitnessed fall at care facility, EMS called.  Possibly patient had some vomit next to her when found on the floor.  Review of Systems  A thorough review of systems was obtained and all systems are negative except as noted in the HPI and PMH.   Patient's Health History    Past Medical History:  Diagnosis Date   Age-related osteoporosis without current pathological fracture    Essential hypertension    Hypercalcemia    Low back pain    Mixed hyperlipidemia    Venous insufficiency (chronic) (peripheral)     Past Surgical History:  Procedure Laterality Date   COLONOSCOPY     GALLBLADDER SURGERY     December 2021    Family History  Problem Relation Age of Onset   AAA (abdominal aortic aneurysm) Mother    CAD Father     Social History   Socioeconomic History   Marital status: Widowed    Spouse name: Not on file   Number of children: Not on file   Years of education: Not on file   Highest education level: Not on file  Occupational History   Not on file  Tobacco Use   Smoking status: Never   Smokeless tobacco: Not on file  Vaping Use   Vaping Use: Never used  Substance and Sexual Activity   Alcohol use: Never   Drug use: Never   Sexual activity: Not Currently  Other Topics Concern   Not on file  Social History Narrative   Not on file   Social Determinants of Health   Financial Resource Strain: Not on file  Food Insecurity: Not on file  Transportation Needs: Not on file  Physical Activity: Not on file  Stress: Not on file  Social Connections: Not on file  Intimate Partner Violence: Not on file     Physical Exam   Vitals:   11/14/22 0555  11/14/22 0630  BP: (!) 193/93 (!) 174/77  Pulse: 77 74  Resp: 16 19  Temp: (!) 97.4 F (36.3 C)   SpO2: 95% 95%    CONSTITUTIONAL: Well-appearing, NAD NEURO/PSYCH:  Alert, awake, oriented to name only, moves all extremities equally EYES:  eyes equal and reactive ENT/NECK:  no LAD, no JVD CARDIO: Regular rate, well-perfused, normal S1 and S2 PULM:  CTAB no wheezing or rhonchi GI/GU:  non-distended, non-tender MSK/SPINE:  No gross deformities, no edema SKIN:  no rash, atraumatic   *Additional and/or pertinent findings included in MDM below  Diagnostic and Interventional Summary    EKG Interpretation  Date/Time:  Saturday November 14 2022 06:21:50 EDT Ventricular Rate:  73 PR Interval:    QRS Duration: 109 QT Interval:  402 QTC Calculation: 443 R Axis:   1 Text Interpretation: Sinus rhythm Artifact LVH with secondary repolarization abnormality Artifact in lead(s) I II III aVR aVL aVF V1 V2 and baseline wander in lead(s) V3 V4 V6 Confirmed by Kennis Carina [phone]) on 11/14/2022 6:42:27 AM       Labs Reviewed  CBG MONITORING, ED - Abnormal; Notable for the following components:      Result Value   Glucose-Capillary 129 (*)    All other components within  normal limits    CT CERVICAL SPINE WO CONTRAST  Final Result    CT HEAD WO CONTRAST ( )  Final Result      Medications - No data to display   Procedures  /  Critical Care Procedures  ED Course and Medical Decision Making  Initial Impression and Ddx Suspect mechanical fall, with possible vomiting and advanced age will obtain CT head to exclude intracranial bleeding.  Clear lungs, soft abdomen, moving all extremities equally, no neck or back pain, doubt significant traumatic injury.  Past medical/surgical history that increases complexity of ED encounter: Dementia  Interpretation of Diagnostics I personally reviewed the EKG and my interpretation is as follows: Sinus rhythm, tremor artifact, no concerning  features  CT imaging without acute injury  Patient Reassessment and Ultimate Disposition/Management     On reassessment patient continues to be pleasant, per report her baseline.  No emergent process is evident, appropriate for discharge.  Patient management required discussion with the following services or consulting groups:  None  Complexity of Problems Addressed Acute illness or injury that poses threat of life of bodily function  Additional Data Reviewed and Analyzed Further history obtained from: EMS on arrival  Additional Factors Impacting ED Encounter Risk None  Elmer Sow. Pilar Plate, MD [facility] Emergency Medicine [facility] [email or web address]  Final Clinical Impressions(s) / ED Diagnoses     ICD-10-CM   1. Fall, initial encounter  [facility]       ED Discharge Orders     None        Discharge Instructions Discussed with and Provided to Patient:     Discharge Instructions      You were evaluated in the Emergency Department and after careful evaluation, we did not find any emergent condition requiring admission or further testing in the hospital.  Your exam/testing today is overall reassuring.  Please return to the Emergency Department if you experience any worsening of your condition.   Thank you for allowing [place] to be a part of your care.       Sabas Sous, MD 11/14/22 [phone]

## 2022-11-14 NOTE — ED Triage Notes (Signed)
Pt from [facility] via [facility]. Pt had an unwitnessed mechanical fall. Unknown if pt hit head.

## 2022-11-14 NOTE — ED Notes (Signed)
This RN attempted to contact pt's emergency contact Rod Holler. No answer at this time. Left msg

## 2022-11-14 NOTE — Discharge Instructions (Signed)
You were evaluated in the Emergency Department and after careful evaluation, we did not find any emergent condition requiring admission or further testing in the hospital.  Your exam/testing today is overall reassuring.  Please return to the Emergency Department if you experience any worsening of your condition.   Thank you for allowing us to be a part of your care. 

## 2022-11-16 ENCOUNTER — Ambulatory Visit: Payer: Medicare Other

## 2022-11-16 ENCOUNTER — Encounter: Payer: Self-pay | Admitting: Physician Assistant

## 2022-11-16 ENCOUNTER — Ambulatory Visit (INDEPENDENT_AMBULATORY_CARE_PROVIDER_SITE_OTHER): Payer: Medicare Other | Admitting: Physician Assistant

## 2022-11-16 ENCOUNTER — Other Ambulatory Visit (INDEPENDENT_AMBULATORY_CARE_PROVIDER_SITE_OTHER): Payer: Medicare Other

## 2022-11-16 VITALS — BP 166/68 | HR 57 | Ht <= 58 in | Wt 141.0 lb

## 2022-11-16 DIAGNOSIS — R413 Other amnesia: Secondary | ICD-10-CM | POA: Diagnosis not present

## 2022-11-16 DIAGNOSIS — G309 Alzheimer's disease, unspecified: Secondary | ICD-10-CM | POA: Diagnosis not present

## 2022-11-16 DIAGNOSIS — I1A0 Resistant hypertension: Secondary | ICD-10-CM | POA: Diagnosis not present

## 2022-11-16 DIAGNOSIS — I119 Hypertensive heart disease without heart failure: Secondary | ICD-10-CM | POA: Diagnosis not present

## 2022-11-16 LAB — TSH: TSH: 0.85 u[IU]/mL (ref 0.35–5.50)

## 2022-11-16 LAB — VITAMIN B12: Vitamin B-12: 900 pg/mL (ref 211–911)

## 2022-11-16 NOTE — Progress Notes (Signed)
B12 and Thyroid levels are normal , thanks

## 2022-11-16 NOTE — Patient Instructions (Signed)
It was a pleasure to see you today at our office.   Recommendations:  Follow up in 6  months Agree with transfer to Memory Care  Check B12 and TSH  Recommend monitoring closely the BP, revise medications Agree with PT/OT for strength and balance     For assessment of decision of mental capacity and competency:  Call Dr. Erick Blinks, geriatric psychiatrist at [phone] Counseling regarding caregiver distress, including caregiver depression, anxiety and issues regarding community resources, adult day care programs, adult living facilities, or memory care questions:  please contact your  Primary Doctor's Social Worker   Whom to call: Memory  decline, memory medications: Call our office [phone]   For psychiatric meds, mood meds: Please have your primary care physician manage these medications.  If you have any severe symptoms of a stroke, or other severe issues such as confusion,severe chills or fever, etc call 911 or go to the ER as you may need to be evaluated further      RECOMMENDATIONS FOR ALL PATIENTS WITH MEMORY PROBLEMS: 1. Continue to exercise (Recommend 30 minutes of walking everyday, or 3 hours every week) 2. Increase social interactions - continue going to [place] and enjoy social gatherings with friends and family 3. Eat healthy, avoid fried foods and eat more fruits and vegetables 4. Maintain adequate blood pressure, blood sugar, and blood cholesterol level. Reducing the risk of stroke and cardiovascular disease also helps promoting better memory. 5. Avoid stressful situations. Live a simple life and avoid aggravations. Organize your time and prepare for the next day in anticipation. 6. Sleep well, avoid any interruptions of sleep and avoid any distractions in the bedroom that may interfere with adequate sleep quality 7. Avoid sugar, avoid sweets as there is a strong link between excessive sugar intake, diabetes, and cognitive impairment We discussed the  Mediterranean diet, which has been shown to help patients reduce the risk of progressive memory disorders and reduces cardiovascular risk. This includes eating fish, eat fruits and green leafy vegetables, nuts like almonds and hazelnuts, walnuts, and also use olive oil. Avoid fast foods and fried foods as much as possible. Avoid sweets and sugar as sugar use has been linked to worsening of memory function.  There is always a concern of gradual progression of memory problems. If this is the case, then we may need to adjust level of care according to patient needs. Support, both to the patient and caregiver, should then be put into place.    The Alzheimer's Association is here all day, every day for people facing Alzheimer's disease through our free 24/7 Helpline: [phone]. The Helpline provides reliable information and support to all those who need assistance, such as individuals living with memory loss, Alzheimer's or other dementia, caregivers, health care professionals and the public.  Our highly trained and knowledgeable staff can help you with: Understanding memory loss, dementia and Alzheimer's  Medications and other treatment options  General information about aging and brain health  Skills to provide quality care and to find the best care from professionals  Legal, financial and living-arrangement decisions Our Helpline also features: Confidential care consultation provided by master's level clinicians who can help with decision-making support, crisis assistance and education on issues families face every day  Help in a caller's preferred language using our translation service that features more than 200 languages and dialects  Referrals to local community programs, services and ongoing support     FALL PRECAUTIONS: Be cautious when walking. Scan the area  for obstacles that may increase the risk of trips and falls. When getting up in the mornings, sit up at the edge of the bed for a  few minutes before getting out of bed. Consider elevating the bed at the head end to avoid drop of blood pressure when getting up. Walk always in a well-lit room (use night lights in the walls). Avoid area rugs or power cords from appliances in the middle of the walkways. Use a walker or a cane if necessary and consider physical therapy for balance exercise. Get your eyesight checked regularly.  FINANCIAL OVERSIGHT: Supervision, especially oversight when making financial decisions or transactions is also recommended.  HOME SAFETY: Consider the safety of the kitchen when operating appliances like stoves, microwave oven, and blender. Consider having supervision and share cooking responsibilities until no longer able to participate in those. Accidents with firearms and other hazards in the house should be identified and addressed as well.   ABILITY TO BE LEFT ALONE: If patient is unable to contact 911 operator, consider using LifeLine, or when the need is there, arrange for someone to stay with patients. Smoking is a fire hazard, consider supervision or cessation. Risk of wandering should be assessed by caregiver and if detected at any point, supervision and safe proof recommendations should be instituted.  MEDICATION SUPERVISION: Inability to self-administer medication needs to be constantly addressed. Implement a mechanism to ensure safe administration of the medications.   DRIVING: Regarding driving, in patients with progressive memory problems, driving will be impaired. We advise to have someone else do the driving if trouble finding directions or if minor accidents are reported. Independent driving assessment is available to determine safety of driving.   If you are interested in the driving assessment, you can contact the following:  [employer] in [place] [phone]  [facility] [phone]  [facility] [phone]  [phone] or [phone]      Mediterranean Diet A Mediterranean diet refers to food and lifestyle choices that are based on the traditions of countries located on the [employer]. This way of eating has been shown to help prevent certain conditions and improve outcomes for people who have chronic diseases, like kidney disease and heart disease. What are tips for following this plan? Lifestyle  Cook and eat meals together with your family, when possible. Drink enough fluid to keep your urine clear or pale yellow. Be physically active every day. This includes: Aerobic exercise like running or swimming. Leisure activities like gardening, walking, or housework. Get 7-8 hours of sleep each night. If recommended by your health care provider, drink red wine in moderation. This means 1 glass a day for nonpregnant women and 2 glasses a day for men. A glass of wine equals 5 oz (150 mL). Reading food labels  Check the serving size of packaged foods. For foods such as rice and pasta, the serving size refers to the amount of cooked product, not dry. Check the total fat in packaged foods. Avoid foods that have saturated fat or trans fats. Check the ingredients list for added sugars, such as corn syrup. Shopping  At the grocery store, buy most of your food from the areas near the walls of the store. This includes: Fresh fruits and vegetables (produce). Grains, beans, nuts, and seeds. Some of these may be available in unpackaged forms or large amounts (in bulk). Fresh seafood. Poultry and eggs. Low-fat dairy products. Buy whole ingredients instead of prepackaged foods. Buy fresh  fruits and vegetables in-season from local farmers markets. Buy frozen fruits and vegetables in resealable bags. If you do not have access to quality fresh seafood, buy precooked frozen shrimp or canned fish, such as tuna, salmon, or sardines. Buy small amounts of raw or cooked vegetables, salads, or olives from the  deli or salad bar at your store. Stock your pantry so you always have certain foods on hand, such as olive oil, canned tuna, canned tomatoes, rice, pasta, and beans. Cooking  Cook foods with extra-virgin olive oil instead of using butter or other vegetable oils. Have meat as a side dish, and have vegetables or grains as your main dish. This means having meat in small portions or adding small amounts of meat to foods like pasta or stew. Use beans or vegetables instead of meat in common dishes like chili or lasagna. Experiment with different cooking methods. Try roasting or broiling vegetables instead of steaming or sauteing them. Add frozen vegetables to soups, stews, pasta, or rice. Add nuts or seeds for added healthy fat at each meal. You can add these to yogurt, salads, or vegetable dishes. Marinate fish or vegetables using olive oil, lemon juice, garlic, and fresh herbs. Meal planning  Plan to eat 1 vegetarian meal one day each week. Try to work up to 2 vegetarian meals, if possible. Eat seafood 2 or more times a week. Have healthy snacks readily available, such as: Vegetable sticks with hummus. Greek yogurt. Fruit and nut trail mix. Eat balanced meals throughout the week. This includes: Fruit: 2-3 servings a day Vegetables: 4-5 servings a day Low-fat dairy: 2 servings a day Fish, poultry, or lean meat: 1 serving a day Beans and legumes: 2 or more servings a week Nuts and seeds: 1-2 servings a day Whole grains: 6-8 servings a day Extra-virgin olive oil: 3-4 servings a day Limit red meat and sweets to only a few servings a month What are my food choices? Mediterranean diet Recommended Grains: Whole-grain pasta. Brown rice. Bulgar wheat. Polenta. Couscous. Whole-wheat bread. Orpah Cobb. Vegetables: Artichokes. Beets. Broccoli. Cabbage. Carrots. Eggplant. Green beans. Chard. Kale. Spinach. Onions. Leeks. Peas. Squash. Tomatoes. Peppers. Radishes. Fruits: Apples. Apricots.  Avocado. Berries. Bananas. Cherries. Dates. Figs. Grapes. Lemons. Melon. Oranges. Peaches. Plums. Pomegranate. Meats and other protein foods: Beans. Almonds. Sunflower seeds. Pine nuts. Peanuts. Cod. Salmon. Scallops. Shrimp. Tuna. Tilapia. Clams. Oysters. Eggs. Dairy: Low-fat milk. Cheese. Greek yogurt. Beverages: Water. Red wine. Herbal tea. Fats and oils: Extra virgin olive oil. Avocado oil. Grape seed oil. Sweets and desserts: [place] yogurt with honey. Baked apples. Poached pears. Trail mix. Seasoning and other foods: Basil. Cilantro. Coriander. Cumin. Mint. Parsley. Sage. Rosemary. Tarragon. Garlic. Oregano. Thyme. Pepper. Balsalmic vinegar. Tahini. Hummus. Tomato sauce. Olives. Mushrooms. Limit these Grains: Prepackaged pasta or rice dishes. Prepackaged cereal with added sugar. Vegetables: Deep fried potatoes (french fries). Fruits: Fruit canned in syrup. Meats and other protein foods: Beef. Pork. Lamb. Poultry with skin. Hot dogs. Tomasa Blase. Dairy: Ice cream. Sour cream. Whole milk. Beverages: Juice. Sugar-sweetened soft drinks. Beer. Liquor and spirits. Fats and oils: Butter. Canola oil. Vegetable oil. Beef fat (tallow). Lard. Sweets and desserts: Cookies. Cakes. Pies. Candy. Seasoning and other foods: Mayonnaise. Premade sauces and marinades. The items listed may not be a complete list. Talk with your dietitian about what dietary choices are right for you. Summary The Mediterranean diet includes both food and lifestyle choices. Eat a variety of fresh fruits and vegetables, beans, nuts, seeds, and whole grains. Limit the amount of  red meat and sweets that you eat. Talk with your health care provider about whether it is safe for you to drink red wine in moderation. This means 1 glass a day for nonpregnant women and 2 glasses a day for men. A glass of wine equals 5 oz (150 mL). This information is not intended to replace advice given to you by your health care provider. Make sure you discuss  any questions you have with your health care provider. Document Released: 02/27/2016 Document Revised: 03/31/2016 Document Reviewed: 02/27/2016 Elsevier Interactive Patient Education  2017 [employer].

## 2022-11-16 NOTE — Progress Notes (Addendum)
Assessment/Plan:    The patient is seen in neurologic consultation at the request of Hasanaj, Myra Gianotti, MD for the evaluation of memory.  Janice [name] is a very pleasant 89 y.o. year old RH female with a history of hypertension, hyperlipidemia, seen today for evaluation of memory loss.  MMSE today is 20/30.  She needs assistance with activities of daily living, has wandering tendencies, difficulty with remembering simple commands and steps, and she does have spatial difficulties as well.  Findings are consistent with dementia, likely due to Alzheimer's disease.  Her most recent CT of the head from April 2024 shows significant atrophy, no acute findings.  She currently lives at [facility], but given these findings, she is to be transferred to the memory care section, agree with the plan, as she could benefit from an social interaction  mental memory stimulation at the facility.  At this moment, there is no indication for antidementia medication, given her advanced age, risk outweighing the benefits, and her cognitive status.  Dementia likely due to Alzheimer's disease, late onset  Check B12, TSH No indication for antidementia medication, given her advanced age, cognitive status and risk outweighing the benefits. Agree with memory care Recommend hearing check  Recommend good control of cardiovascular risk factors.   Continue to control mood as per PCP Folllow up in 6 months  Subjective:    The patient is accompanied by [facility] caregiver Angie who supplements the history.       How long did patient have memory difficulties? Patient denies any memory issues. However Angie reports that she knows her for at least a decade, and has noticed cognitive decline. She has difficulty remembering recent conversations and new information. 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 She believes that she lives in her old house, and that she is still married  (husband died many years ago), she is still quilting and meeting (she has not done that in a long time).  She ha been on the [facility] since  03/25/21 and is the process of moving in of the memory care due to worsening of memory  repeats oneself?  Endorsed by patient and caregiver Disoriented when walking into a room?  She needs redirection at times, she has a wander guard just in case. Leaving objects in unusual places?  She misplaces things   Wandering behavior? "She has a wander guard now". She was trying to go home (her old home in [place]).  The other day she went to the car from the facility and was "ready to go ".  She has been attempting to leave more frequently than prior. Any personality changes since last visit? denies.  "She is very pleasant". Any  depression?:  denies   Hallucinations or paranoia?  Sometimes she sees her deceased husband and talks to him.  No paranoia. Seizures? denies    Any sleep changes?  She sleeps well.  Denies  vivid dreams, REM behavior or sleepwalking   Sleep apnea? denies   Any hygiene concerns?  denies   Independent of bathing and dressing?  Endorsed  Does the patient need help with medications?  Facility is in charge Who is in charge of the finances?  Family is in charge     Any changes in appetite?   denies     Patient have trouble swallowing?  denies   Does the patient cook?  She no longer cooks. Any headaches?  denies   Chronic back pain?  denies   Ambulates with difficulty?  She uses a walker to ambulate. Recent falls or head injuries?  She falls frequently, most recently a few days back, when she hit her head without LOC and negative CT.  She reports that she does not remember what to put the hands on the walker and then falls.  She also reports that sometimes does not remember what to put the foot to move. Vision changes?  Denies Unilateral weakness, numbness or tingling?  denies   Any tremors?  Endorsed.  She has a history of essential  tremors, not worse than before.  These are recent right greater than left.  She also has mild facial tremor.  In the past, she had been placed on primidone, but she was allergic to it, however, there are no other parkinsonian signs, and she is able to perform her ADLs as tolerated.   Any anosmia?  denies   Any incontinence of urine?  Endorsed, she wears depends. Any bowel dysfunction?    denies      Patient lives "In [facility], but they keep telling me that I live  at [place] "   History of heavy alcohol intake? denies   History of heavy tobacco use? denies   Family history of dementia?   Denies Does patient drive?  She no longer drives   Allergies  Allergen Reactions   Iodinated Contrast Media Swelling    Other reaction(s): Hypotension, Other (see comments) Other reaction(s): Hypotension Other reaction(s): Hypotension    Iodine Swelling    Other reaction(s): Abdominal Pain, Other (see comments) Other reaction(s): Abdominal Pain Other reaction(s): Abdominal Pain    Nifedipine Swelling    Other reaction(s): Other (see comments) Blood vessels swelled large in legs Blood vessels swelled large in legs Blood vessels swelled large in legs Blood vessels swelled large in legs    Penicillins Hives and Rash   Primidone     Other reaction(s): Hypertension, Other (see comments) Stroke type symptoms, slurred speech, weakness Other reaction(s): Hypertension Stroke type symptoms, slurred speech, weakness Stroke type symptoms, slurred speech, weakness Other reaction(s): Hypertension Stroke type symptoms, slurred speech, weakness    Codeine Nausea And Vomiting   Erythromycin Nausea And Vomiting and Rash   Isosorbide     Other reaction(s): Other (see comments) Not sure Not sure Not sure Not sure    Peanut Oil     Other reaction(s): Other (see comments) Stomach cramps Stomach cramps Stomach cramps Stomach cramps    Fish Oil    Imdur [Isosorbide Nitrate]     Shellfish Allergy     Current Outpatient Medications  Medication Instructions   acetaminophen (TYLENOL) 650 mg, Oral, Every 6 hours PRN   alendronate (FOSAMAX) 70 mg, Oral, Weekly   amLODipine (NORVASC) 5 mg, Oral, Daily   atenolol (TENORMIN) 50 mg, Oral, Daily   Cholecalciferol (VITAMIN D-3) 25 MCG (1000 UT) CAPS 1 capsule, Oral, Daily   cinacalcet (SENSIPAR) 30 mg, Oral, Daily with breakfast   CRANBERRY CONCENTRATE PO 1 tablet, Oral, Daily   cyanocobalamin (VITAMIN B12) 1,000 mcg, Oral, Daily   diclofenac Sodium (VOLTAREN) 2 g, Topical, 2 times daily   fenofibrate 54 MG tablet 1 tablet, Oral, Daily   fluticasone (FLONASE) 50 MCG/ACT nasal spray 1 spray, Each Nare, Daily   fosfomycin (MONUROL) 3 g PACK Take 3 gm by mouth every 10 days for UTI prevention   furosemide (LASIX) 20 mg, Oral, Daily   lidocaine (LIDODERM)  5 % 1 patch, Transdermal, Every 24 hours, Remove & Discard patch within 12 hours or as directed by MD   LORazepam (ATIVAN) 0.5 mg, Oral,  Once   mirabegron ER (MYRBETRIQ) 50 mg, Oral, Daily   Multiple Vitamins-Minerals (OCUVITE PO) Oral   olmesartan (BENICAR) 40 mg, Oral, Daily   pantoprazole (PROTONIX) 40 mg, Oral, Daily   Potassium Chloride ER 20 MEQ TBCR 1 tablet, Oral, Daily   sertraline (ZOLOFT) 25 mg, Oral, Daily     VITALS:   Vitals:   11/16/22 0746  BP: (!) 166/68  Pulse: (!) 57  SpO2: 93%  Weight: 141 lb (64 kg)  Height: 4\' 8"  (1.422 m)       No data to display          PHYSICAL EXAM   HEENT:  Normocephalic, atraumatic. The mucous membranes are moist. The superficial temporal arteries are without ropiness or tenderness. Cardiovascular: Regular rate and rhythm. Lungs: Clear to auscultation bilaterally. Neck: There are no carotid bruits noted bilaterally.  NEUROLOGICAL:     No data to display             11/16/2022   11:00 AM  MMSE - Mini Mental State Exam  Orientation to time 1  Orientation to Place 3  Registration 3  Attention/  Calculation 5  Recall 0  Language- name 2 objects 2  Language- repeat 1  Language- follow 3 step command 3  Language- read & follow direction 1  Write a sentence 1  Copy design 0  Total score 20     Orientation:  Alert and oriented to person, not to place or time. No aphasia or dysarthria. Fund of knowledge is reduced.  Recent and remote memory impaired.  Attention and concentration are normal.  Able to name objects and repeat phrases. Delayed recall 0/3. Cranial nerves: There is good facial symmetry. Extraocular muscles are intact and visual fields are full to confrontational testing. Speech is fluent although at times may be tangential, but clear.  No tongue deviation. Hearing is slightly decreased to conversational tone.  Tone: Tone is good throughout.  No cogwheeling is noted Tremor :B R>L resting and intention tremor, mild had tremor.  Sensation: Sensation is intact to light touch and pinprick throughout. Vibration is intact at the bilateral big toe.There is no extinction with double simultaneous stimulation.  Coordination: The patient has difficulty with RAM's  and FNF  on the L, normal on the R.   Abnormal L finger to nose  Motor: Strength is 5/5 in the bilateral upper and lower extremities. There is no pronator drift. There are no fasciculations noted. DTR's: Deep tendon reflexes are 2/4 at the bilateral biceps, triceps, brachioradialis, patella and achilles.  Plantar responses are downgoing bilaterally. Gait and Station: The patient is able to ambulate with Rolator, leans forward, stride normal, gait cautious and narrow.      Thank you for allowing [place] the opportunity to participate in the care of this nice patient. Please do not hesitate to contact us for any questions or concerns.   Total time spent on today's visit was 50 minutes dedicated to this patient today, preparing to see patient, examining the patient, ordering tests and/or medications and counseling the patient,  documenting clinical information in the EHR or other health record, independently interpreting results and communicating results to the patient/family, discussing treatment and goals, answering patient's questions and coordinating care.  Cc:  Toma Deiters, MD  Marlowe Kays 11/16/2022 11:59 AM

## 2022-11-17 DIAGNOSIS — E876 Hypokalemia: Secondary | ICD-10-CM | POA: Diagnosis not present

## 2022-11-17 DIAGNOSIS — E782 Mixed hyperlipidemia: Secondary | ICD-10-CM | POA: Diagnosis not present

## 2022-11-17 DIAGNOSIS — G309 Alzheimer's disease, unspecified: Secondary | ICD-10-CM | POA: Diagnosis not present

## 2022-11-17 DIAGNOSIS — I10 Essential (primary) hypertension: Secondary | ICD-10-CM | POA: Diagnosis not present

## 2022-11-17 DIAGNOSIS — I119 Hypertensive heart disease without heart failure: Secondary | ICD-10-CM | POA: Diagnosis not present

## 2022-11-17 DIAGNOSIS — M545 Low back pain, unspecified: Secondary | ICD-10-CM | POA: Diagnosis not present

## 2022-11-17 DIAGNOSIS — M810 Age-related osteoporosis without current pathological fracture: Secondary | ICD-10-CM | POA: Diagnosis not present

## 2022-11-17 DIAGNOSIS — F419 Anxiety disorder, unspecified: Secondary | ICD-10-CM | POA: Diagnosis not present

## 2022-11-17 DIAGNOSIS — F331 Major depressive disorder, recurrent, moderate: Secondary | ICD-10-CM | POA: Diagnosis not present

## 2022-11-17 DIAGNOSIS — I872 Venous insufficiency (chronic) (peripheral): Secondary | ICD-10-CM | POA: Diagnosis not present

## 2022-11-19 DIAGNOSIS — I872 Venous insufficiency (chronic) (peripheral): Secondary | ICD-10-CM | POA: Diagnosis not present

## 2022-11-19 DIAGNOSIS — E782 Mixed hyperlipidemia: Secondary | ICD-10-CM | POA: Diagnosis not present

## 2022-11-19 DIAGNOSIS — I10 Essential (primary) hypertension: Secondary | ICD-10-CM | POA: Diagnosis not present

## 2022-11-19 DIAGNOSIS — M545 Low back pain, unspecified: Secondary | ICD-10-CM | POA: Diagnosis not present

## 2022-11-19 DIAGNOSIS — E876 Hypokalemia: Secondary | ICD-10-CM | POA: Diagnosis not present

## 2022-11-19 DIAGNOSIS — M810 Age-related osteoporosis without current pathological fracture: Secondary | ICD-10-CM | POA: Diagnosis not present

## 2022-11-24 DIAGNOSIS — F0154 Vascular dementia, unspecified severity, with anxiety: Secondary | ICD-10-CM | POA: Diagnosis not present

## 2022-11-24 DIAGNOSIS — I872 Venous insufficiency (chronic) (peripheral): Secondary | ICD-10-CM | POA: Diagnosis not present

## 2022-11-24 DIAGNOSIS — M810 Age-related osteoporosis without current pathological fracture: Secondary | ICD-10-CM | POA: Diagnosis not present

## 2022-11-24 DIAGNOSIS — F331 Major depressive disorder, recurrent, moderate: Secondary | ICD-10-CM | POA: Diagnosis not present

## 2022-11-24 DIAGNOSIS — E876 Hypokalemia: Secondary | ICD-10-CM | POA: Diagnosis not present

## 2022-11-24 DIAGNOSIS — M545 Low back pain, unspecified: Secondary | ICD-10-CM | POA: Diagnosis not present

## 2022-11-24 DIAGNOSIS — M79674 Pain in right toe(s): Secondary | ICD-10-CM | POA: Diagnosis not present

## 2022-11-24 DIAGNOSIS — B351 Tinea unguium: Secondary | ICD-10-CM | POA: Diagnosis not present

## 2022-11-24 DIAGNOSIS — G309 Alzheimer's disease, unspecified: Secondary | ICD-10-CM | POA: Diagnosis not present

## 2022-11-24 DIAGNOSIS — F419 Anxiety disorder, unspecified: Secondary | ICD-10-CM | POA: Diagnosis not present

## 2022-11-24 DIAGNOSIS — I10 Essential (primary) hypertension: Secondary | ICD-10-CM | POA: Diagnosis not present

## 2022-11-24 DIAGNOSIS — M79675 Pain in left toe(s): Secondary | ICD-10-CM | POA: Diagnosis not present

## 2022-11-24 DIAGNOSIS — E782 Mixed hyperlipidemia: Secondary | ICD-10-CM | POA: Diagnosis not present

## 2022-11-26 ENCOUNTER — Other Ambulatory Visit: Payer: Self-pay

## 2022-11-26 ENCOUNTER — Emergency Department (HOSPITAL_COMMUNITY): Payer: Medicare Other

## 2022-11-26 ENCOUNTER — Emergency Department (HOSPITAL_COMMUNITY)
Admission: EM | Admit: 2022-11-26 | Discharge: 2022-11-26 | Disposition: A | Discharge status: Home / Self Care | Payer: Medicare Other | Attending: Emergency Medicine | Admitting: Emergency Medicine

## 2022-11-26 ENCOUNTER — Encounter (HOSPITAL_COMMUNITY): Payer: Self-pay

## 2022-11-26 DIAGNOSIS — M542 Cervicalgia: Secondary | ICD-10-CM | POA: Insufficient documentation

## 2022-11-26 DIAGNOSIS — W01198A Fall on same level from slipping, tripping and stumbling with subsequent striking against other object, initial encounter: Secondary | ICD-10-CM | POA: Insufficient documentation

## 2022-11-26 DIAGNOSIS — R519 Headache, unspecified: Secondary | ICD-10-CM | POA: Insufficient documentation

## 2022-11-26 DIAGNOSIS — M25561 Pain in right knee: Secondary | ICD-10-CM | POA: Insufficient documentation

## 2022-11-26 DIAGNOSIS — M25511 Pain in right shoulder: Secondary | ICD-10-CM | POA: Diagnosis not present

## 2022-11-26 DIAGNOSIS — Z79899 Other long term (current) drug therapy: Secondary | ICD-10-CM | POA: Insufficient documentation

## 2022-11-26 DIAGNOSIS — S299XXA Unspecified injury of thorax, initial encounter: Secondary | ICD-10-CM | POA: Diagnosis not present

## 2022-11-26 DIAGNOSIS — S0990XA Unspecified injury of head, initial encounter: Secondary | ICD-10-CM | POA: Diagnosis not present

## 2022-11-26 DIAGNOSIS — W19XXXA Unspecified fall, initial encounter: Secondary | ICD-10-CM

## 2022-11-26 DIAGNOSIS — Z91010 Allergy to peanuts: Secondary | ICD-10-CM | POA: Insufficient documentation

## 2022-11-26 DIAGNOSIS — I10 Essential (primary) hypertension: Secondary | ICD-10-CM | POA: Diagnosis not present

## 2022-11-26 DIAGNOSIS — R079 Chest pain, unspecified: Secondary | ICD-10-CM | POA: Diagnosis not present

## 2022-11-26 DIAGNOSIS — S199XXA Unspecified injury of neck, initial encounter: Secondary | ICD-10-CM | POA: Diagnosis not present

## 2022-11-26 DIAGNOSIS — R102 Pelvic and perineal pain: Secondary | ICD-10-CM | POA: Diagnosis not present

## 2022-11-26 MED ORDER — AMLODIPINE BESYLATE 5 MG PO TABS
5.0000 mg | ORAL_TABLET | Freq: Once | ORAL | Status: AC
  Administered 2022-11-26: 5 mg via ORAL
  Filled 2022-11-26: qty 1

## 2022-11-26 MED ORDER — ACETAMINOPHEN 325 MG PO TABS
650.0000 mg | ORAL_TABLET | Freq: Once | ORAL | Status: AC
  Administered 2022-11-26: 650 mg via ORAL
  Filled 2022-11-26: qty 2

## 2022-11-26 MED ORDER — HYDRALAZINE HCL 10 MG PO TABS
10.0000 mg | ORAL_TABLET | Freq: Once | ORAL | Status: AC
  Administered 2022-11-26: 10 mg via ORAL
  Filled 2022-11-26: qty 1

## 2022-11-26 NOTE — ED Provider Notes (Signed)
[place] EMERGENCY DEPARTMENT AT [facility] Provider Note   CSN: [id number] Arrival date & time: 11/26/22  1739     History  Chief Complaint  Patient presents with   [facility] is a 89 y.o. female.   Fall   89 year old female presents emergency department with complaints of fall.  Patient states that she was standing and may have tripped over her shoes when she fell backwards striking her head on the floor.  Denies loss of consciousness or blood thinner use.  Currently complaining of mild headache, neck pain, right shoulder pain, right knee pain.  Patient states she has been able to ambulate independently since incident without difficulty.  Denies any visual disturbance from baseline, gait abnormality from baseline, weakness/sensory deficits in upper or lower extremities, slurred speech, facial droop.  Denies chest pain, shortness of breath, abdominal pain, nausea, vomiting, urinary symptoms, change in bowel habits.  Past medical history significant for hypercalcemia, hyperlipidemia, osteoporosis, hypertension  Home Medications Prior to Admission medications   Medication Sig Start Date End Date Taking? Authorizing Provider  acetaminophen (TYLENOL) 325 MG tablet Take 650 mg by mouth every 6 (six) hours as needed.    [provider]  alendronate (FOSAMAX) 70 MG tablet Take 70 mg by mouth once a week. 05/28/20   [provider]  amLODipine (NORVASC) 5 MG tablet Take 5 mg by mouth daily. 07/16/22   [provider]  atenolol (TENORMIN) 50 MG tablet Take 50 mg by mouth daily. 03/18/20   [provider]  Cholecalciferol (VITAMIN D-3) 25 MCG (1000 UT) CAPS Take 1 capsule by mouth daily.    [provider]  cinacalcet (SENSIPAR) 30 MG tablet Take 1 tablet (30 mg total) by mouth daily with breakfast. 10/27/22   Nida, Denman George, MD  CRANBERRY CONCENTRATE PO Take 1 tablet by mouth daily.    [provider]   diclofenac Sodium (VOLTAREN) 1 % GEL Apply 2 g topically 2 (two) times daily.    [provider]  fenofibrate 54 MG tablet Take 1 tablet by mouth daily. 04/23/20   [provider]  fluticasone (FLONASE) 50 MCG/ACT nasal spray Place 1 spray into both nostrils daily.    [provider]  fosfomycin (MONUROL) 3 g PACK Take 3 gm by mouth every 10 days for UTI prevention Patient not taking: Reported on 07/21/2022 11/11/21   Stoneking, Danford Bad., MD  furosemide (LASIX) 20 MG tablet Take 20 mg by mouth daily. 03/12/20   [provider]  lidocaine (LIDODERM) 5 % Place 1 patch onto the skin daily. Remove & Discard patch within 12 hours or as directed by MD 07/21/22   Rondel Baton, MD  LORazepam (ATIVAN) 0.5 MG tablet Take 0.5 mg by mouth once. 09/30/21   [provider]  mirabegron ER (MYRBETRIQ) 50 MG TB24 tablet Take 1 tablet (50 mg total) by mouth daily. 11/07/21   Stoneking, Danford Bad., MD  Multiple Vitamins-Minerals (OCUVITE PO) Take by mouth.    [provider]  olmesartan (BENICAR) 40 MG tablet Take 40 mg by mouth daily. 05/25/21   [provider]  pantoprazole (PROTONIX) 40 MG tablet Take 40 mg by mouth daily. 04/25/20   [provider]  Potassium Chloride ER 20 MEQ TBCR Take 1 tablet by mouth daily. 05/26/22   [provider]  sertraline (ZOLOFT) 25 MG tablet Take 25 mg by mouth daily. 07/16/22   [provider]  vitamin B-12 (CYANOCOBALAMIN)  1000 MCG tablet Take 1,000 mcg by mouth daily.    [provider]      Allergies    Iodinated contrast media, Iodine, Nifedipine, Penicillins, Primidone, Codeine, Erythromycin, Isosorbide, Peanut oil, Fish oil, Imdur [isosorbide nitrate], and Shellfish allergy    Review of Systems   Review of Systems  All other systems reviewed and are negative.   Physical Exam Updated Vital Signs BP (!) 215/139   Pulse 60   Temp 97.8 F (36.6 C) (Oral)   Resp 20   Ht  4\' 8"  (1.422 m)   Wt 64 kg   SpO2 92%   BMI 31.63 kg/m  Physical Exam Vitals and nursing note reviewed.  Constitutional:      General: She is not in acute distress.    Appearance: She is well-developed.  HENT:     Head: Normocephalic.     Comments: Mild swelling noted in occipital region.  No obvious laceration or current ecchymosis/abrasion. Eyes:     Conjunctiva/sclera: Conjunctivae normal.  Cardiovascular:     Rate and Rhythm: Normal rate and regular rhythm.     Heart sounds: No murmur heard. Pulmonary:     Effort: Pulmonary effort is normal. No respiratory distress.     Breath sounds: Normal breath sounds.  Abdominal:     Palpations: Abdomen is soft.     Tenderness: There is no abdominal tenderness.  Musculoskeletal:        General: No swelling.     Cervical back: Neck supple.     Comments: Mild midline tenderness of liver cervical spine with upper thoracic spine tenderness to palpation.  No other midline tenderness to palpation of cervical, thoracic, lumbar spine.  Mild tenderness to palpation of right proximal humerus.  Mild tenderness to palpation of medial joint line of right knee.  Otherwise, upper lower extremities without tenderness to palpation.  Patient moves all 4 extremities without difficulty.    Skin:    General: Skin is warm and dry.     Capillary Refill: Capillary refill takes less than 2 seconds.  Neurological:     Mental Status: She is alert.     Comments: Alert and oriented to self, place, time and event.   Speech is fluent, clear without dysarthria or dysphasia.   Strength symmetric in upper/lower extremities   Sensation intact in upper/lower extremities   Normal gait.  CN I not tested  CN II not tested CN III, IV, VI PERRLA and EOMs intact bilaterally  CN V Intact sensation to sharp and light touch to the face  CN VII facial movements symmetric  CN VIII not tested  CN IX, X no uvula deviation, symmetric rise of soft palate  CN XI symmetric SCM  and trapezius strength bilaterally  CN XII Midline tongue protrusion, symmetric L/R movements     Psychiatric:        Mood and Affect: Mood normal.     ED Results / Procedures / Treatments   Labs (all labs ordered are listed, but only abnormal results are displayed) Labs Reviewed - No data to display  EKG None  Radiology No results found.  Procedures Procedures    Medications Ordered in ED Medications  acetaminophen (TYLENOL) tablet 650 mg (has no administration in time range)    ED Course/ Medical Decision Making/ A&P                             Medical Decision Making  Amount and/or Complexity of Data Reviewed Radiology: ordered.   This patient presents to the ED for concern of fall, this involves an extensive number of treatment options, and is a complaint that carries with it a high risk of complications and morbidity.  The differential diagnosis includes CVA, fracture, strain/sprain, dislocation, spinal cord injury, pneumothorax, solid organ damage   Co morbidities that complicate the patient evaluation  See HPI   Additional history obtained:  Additional history obtained from EMR External records from outside source obtained and reviewed including hospital records   Lab Tests:  N/a   Imaging Studies ordered:  I ordered imaging studies including CT head/C-spine/T-spine, pelvis x-ray, right knee x-ray, right shoulder x-ray, chest x-ray which are pending   Cardiac Monitoring: / EKG:  The patient was maintained on a cardiac monitor.  I personally viewed and interpreted the cardiac monitored which showed an underlying rhythm of: Sinus rhythm   Consultations Obtained:  N/a   Problem List / ED Course / Critical interventions / Medication management  Fall I ordered medication including Tylenol for   Reevaluation of the patient after these medicines showed that the patient improved I have reviewed the patients home medicines and have made  adjustments as needed   Social Determinants of Health:  Denies tobacco, licit drug use   Test / Admission - Considered:  Fall Imaging studies pending 89 year old female presents after mechanical fall.  Patient's traumatic workup largely pending at this time with no imaging resulting.  History of change, patient care off to [facility].  Disposition pending at this time.  Patient stable at shift change.        Final Clinical Impression(s) / ED Diagnoses Final diagnoses:  None    Rx / DC Orders ED Discharge Orders     None         Peter Garter, [place] 11/26/22 1904    Benjiman Core, MD 11/28/22 1442

## 2022-11-26 NOTE — Discharge Instructions (Addendum)
Evaluation of your fall today was reassuring.  CT scans and x-rays were all negative.  Recommend you follow-up with your PCP.  If you have any worsening pain weakness or numbness in your extremities, facial droop, slurred speech, unsteady gait or any other concerning symptom please return emerged part for evaluation.

## 2022-11-26 NOTE — ED Provider Notes (Signed)
Accepted handoff at shift change from [facility]. Please see prior provider note for more detail.   Briefly: Patient is 89 y.o. presenting for mechanical fall earlier today. Patient not on blood thinners  DDX: concern for CVA, fracture, strain/sprain, dislocation, spinal cord injury, pneumothorax, solid organ damage   Plan: Follow-up on x-ray and CT scans if negative can follow-up with PCP    Physical Exam  BP (!) 229/159   Pulse (!) 59   Temp 97.8 F (36.6 C) (Oral)   Resp 17   Ht 4\' 8"  (1.422 m)   Wt 64 kg   SpO2 94%   BMI 31.63 kg/m   Physical Exam  Procedures  Procedures  ED Course / MDM    Medical Decision Making Amount and/or Complexity of Data Reviewed Radiology: ordered.  Risk OTC drugs. Prescription drug management.   Scans and x-rays negative for acute injury.  Advised follow-up PCP.  Discussed pertinent return precautions.  Blood pressure improved on last check.  Vital stable at discharge.       Gareth Eagle, PA-C 11/26/22 1939    Benjiman Core, MD 11/27/22 [phone]

## 2022-11-26 NOTE — ED Triage Notes (Signed)
Patient had a fall from standing. She has had some other falls recently. Today patient feel backwards hitting back of head. She denies LOC and is not on any blood thinners

## 2022-11-28 DIAGNOSIS — E782 Mixed hyperlipidemia: Secondary | ICD-10-CM | POA: Diagnosis not present

## 2022-11-28 DIAGNOSIS — M545 Low back pain, unspecified: Secondary | ICD-10-CM | POA: Diagnosis not present

## 2022-11-28 DIAGNOSIS — Z8616 Personal history of COVID-19: Secondary | ICD-10-CM | POA: Diagnosis not present

## 2022-11-28 DIAGNOSIS — M810 Age-related osteoporosis without current pathological fracture: Secondary | ICD-10-CM | POA: Diagnosis not present

## 2022-11-28 DIAGNOSIS — E876 Hypokalemia: Secondary | ICD-10-CM | POA: Diagnosis not present

## 2022-11-28 DIAGNOSIS — I10 Essential (primary) hypertension: Secondary | ICD-10-CM | POA: Diagnosis not present

## 2022-11-28 DIAGNOSIS — I872 Venous insufficiency (chronic) (peripheral): Secondary | ICD-10-CM | POA: Diagnosis not present

## 2022-11-28 DIAGNOSIS — R296 Repeated falls: Secondary | ICD-10-CM | POA: Diagnosis not present

## 2022-11-30 DIAGNOSIS — I119 Hypertensive heart disease without heart failure: Secondary | ICD-10-CM | POA: Diagnosis not present

## 2022-11-30 DIAGNOSIS — G309 Alzheimer's disease, unspecified: Secondary | ICD-10-CM | POA: Diagnosis not present

## 2022-11-30 DIAGNOSIS — K219 Gastro-esophageal reflux disease without esophagitis: Secondary | ICD-10-CM | POA: Diagnosis not present

## 2022-11-30 DIAGNOSIS — I1A0 Resistant hypertension: Secondary | ICD-10-CM | POA: Diagnosis not present

## 2022-11-30 DIAGNOSIS — F0154 Vascular dementia, unspecified severity, with anxiety: Secondary | ICD-10-CM | POA: Diagnosis not present

## 2022-11-30 DIAGNOSIS — E782 Mixed hyperlipidemia: Secondary | ICD-10-CM | POA: Diagnosis not present

## 2022-11-30 DIAGNOSIS — W19XXXD Unspecified fall, subsequent encounter: Secondary | ICD-10-CM | POA: Diagnosis not present

## 2022-12-01 DIAGNOSIS — F419 Anxiety disorder, unspecified: Secondary | ICD-10-CM | POA: Diagnosis not present

## 2022-12-01 DIAGNOSIS — F331 Major depressive disorder, recurrent, moderate: Secondary | ICD-10-CM | POA: Diagnosis not present

## 2022-12-02 DIAGNOSIS — E876 Hypokalemia: Secondary | ICD-10-CM | POA: Diagnosis not present

## 2022-12-02 DIAGNOSIS — M810 Age-related osteoporosis without current pathological fracture: Secondary | ICD-10-CM | POA: Diagnosis not present

## 2022-12-02 DIAGNOSIS — E782 Mixed hyperlipidemia: Secondary | ICD-10-CM | POA: Diagnosis not present

## 2022-12-02 DIAGNOSIS — M545 Low back pain, unspecified: Secondary | ICD-10-CM | POA: Diagnosis not present

## 2022-12-02 DIAGNOSIS — I872 Venous insufficiency (chronic) (peripheral): Secondary | ICD-10-CM | POA: Diagnosis not present

## 2022-12-02 DIAGNOSIS — I10 Essential (primary) hypertension: Secondary | ICD-10-CM | POA: Diagnosis not present

## 2022-12-07 DIAGNOSIS — F331 Major depressive disorder, recurrent, moderate: Secondary | ICD-10-CM | POA: Diagnosis not present

## 2022-12-07 DIAGNOSIS — F419 Anxiety disorder, unspecified: Secondary | ICD-10-CM | POA: Diagnosis not present

## 2022-12-08 DIAGNOSIS — T50Z95A Adverse effect of other vaccines and biological substances, initial encounter: Secondary | ICD-10-CM | POA: Diagnosis not present

## 2022-12-08 DIAGNOSIS — R1110 Vomiting, unspecified: Secondary | ICD-10-CM | POA: Diagnosis not present

## 2022-12-10 DIAGNOSIS — M810 Age-related osteoporosis without current pathological fracture: Secondary | ICD-10-CM | POA: Diagnosis not present

## 2022-12-10 DIAGNOSIS — E876 Hypokalemia: Secondary | ICD-10-CM | POA: Diagnosis not present

## 2022-12-10 DIAGNOSIS — M545 Low back pain, unspecified: Secondary | ICD-10-CM | POA: Diagnosis not present

## 2022-12-10 DIAGNOSIS — E782 Mixed hyperlipidemia: Secondary | ICD-10-CM | POA: Diagnosis not present

## 2022-12-10 DIAGNOSIS — I10 Essential (primary) hypertension: Secondary | ICD-10-CM | POA: Diagnosis not present

## 2022-12-10 DIAGNOSIS — I872 Venous insufficiency (chronic) (peripheral): Secondary | ICD-10-CM | POA: Diagnosis not present

## 2022-12-12 ENCOUNTER — Emergency Department (HOSPITAL_COMMUNITY): Payer: Medicare Other

## 2022-12-12 ENCOUNTER — Encounter (HOSPITAL_COMMUNITY): Payer: Self-pay | Admitting: *Deleted

## 2022-12-12 ENCOUNTER — Emergency Department (HOSPITAL_COMMUNITY)
Admission: EM | Admit: 2022-12-12 | Discharge: 2022-12-12 | Disposition: A | Discharge status: Home / Self Care | Payer: Medicare Other | Attending: Emergency Medicine | Admitting: Emergency Medicine

## 2022-12-12 ENCOUNTER — Other Ambulatory Visit: Payer: Self-pay

## 2022-12-12 DIAGNOSIS — M25551 Pain in right hip: Secondary | ICD-10-CM | POA: Insufficient documentation

## 2022-12-12 DIAGNOSIS — W19XXXA Unspecified fall, initial encounter: Secondary | ICD-10-CM | POA: Diagnosis not present

## 2022-12-12 DIAGNOSIS — S0990XA Unspecified injury of head, initial encounter: Secondary | ICD-10-CM | POA: Diagnosis not present

## 2022-12-12 DIAGNOSIS — Z91010 Allergy to peanuts: Secondary | ICD-10-CM | POA: Insufficient documentation

## 2022-12-12 DIAGNOSIS — W1830XA Fall on same level, unspecified, initial encounter: Secondary | ICD-10-CM | POA: Diagnosis not present

## 2022-12-12 DIAGNOSIS — Z043 Encounter for examination and observation following other accident: Secondary | ICD-10-CM | POA: Diagnosis not present

## 2022-12-12 DIAGNOSIS — I10 Essential (primary) hypertension: Secondary | ICD-10-CM | POA: Insufficient documentation

## 2022-12-12 LAB — CBC
HCT: 42.4 % (ref 36.0–46.0)
Hemoglobin: 13.7 g/dL (ref 12.0–15.0)
MCH: 30.6 pg (ref 26.0–34.0)
MCHC: 32.3 g/dL (ref 30.0–36.0)
MCV: 94.9 fL (ref 80.0–100.0)
Platelets: 267 10*3/uL (ref 150–400)
RBC: 4.47 MIL/uL (ref 3.87–5.11)
RDW: 12.7 % (ref 11.5–15.5)
WBC: 11.7 10*3/uL — ABNORMAL HIGH (ref 4.0–10.5)
nRBC: 0 % (ref 0.0–0.2)

## 2022-12-12 LAB — BASIC METABOLIC PANEL
Anion gap: 10 (ref 5–15)
BUN: 18 mg/dL (ref 8–23)
CO2: 24 mmol/L (ref 22–32)
Calcium: 9.2 mg/dL (ref 8.9–10.3)
Chloride: 104 mmol/L (ref 98–111)
Creatinine, Ser: 0.97 mg/dL (ref 0.44–1.00)
GFR, Estimated: 56 mL/min — ABNORMAL LOW (ref >60)
Glucose, Bld: 98 mg/dL (ref 70–99)
Potassium: 4.5 mmol/L (ref 3.5–5.1)
Sodium: 138 mmol/L (ref 135–145)

## 2022-12-12 NOTE — Discharge Instructions (Addendum)
Your blood pressure was elevated today.  Be sure to take your blood pressure medicines.  Follow-up with your doctors.  The x-rays were reassuring in terms of injury from the falls.

## 2022-12-12 NOTE — ED Triage Notes (Signed)
Pt with multiple falls- today, yesterday and day before per report.  C/o right hip stinging pain. Pt has not received her daily medications inc her HTN med. Reported that pt was assisted up and able to stand with assist. Reported no LOC or HA's

## 2022-12-12 NOTE — ED Provider Notes (Signed)
[place] EMERGENCY DEPARTMENT AT [facility] Provider Note   CSN: [id number] Arrival date & time: 12/12/22  1553     History  Chief Complaint  Patient presents with   [facility] Janice [name] is a 89 y.o. female.   Fall  Patient presents from nursing home.  Reportedly has had falls.  Patient states she does have some pain in her right hip but states that is from previous falls.  States she fell because she was getting up because she was going to have her baby.  Patient states though she does not think that is true.  States she did hit her head.  Somewhat difficult in history.  Without complaints however except chronic right hip pain.  Denies back pain. Per report had not had her daily medication today although patient states she did take it.     Home Medications Prior to Admission medications   Medication Sig Start Date End Date Taking? Authorizing Provider  acetaminophen (TYLENOL) 325 MG tablet Take 650 mg by mouth every 6 (six) hours as needed.    [provider]  alendronate (FOSAMAX) 70 MG tablet Take 70 mg by mouth once a week. 05/28/20   [provider]  amLODipine (NORVASC) 5 MG tablet Take 5 mg by mouth daily. 07/16/22   [provider]  atenolol (TENORMIN) 50 MG tablet Take 50 mg by mouth daily. 03/18/20   [provider]  Cholecalciferol (VITAMIN D-3) 25 MCG (1000 UT) CAPS Take 1 capsule by mouth daily.    [provider]  cinacalcet (SENSIPAR) 30 MG tablet Take 1 tablet (30 mg total) by mouth daily with breakfast. 10/27/22   Nida, Denman George, MD  CRANBERRY CONCENTRATE PO Take 1 tablet by mouth daily.    [provider]  diclofenac Sodium (VOLTAREN) 1 % GEL Apply 2 g topically 2 (two) times daily.    [provider]  fenofibrate 54 MG tablet Take 1 tablet by mouth daily. 04/23/20   [provider]  fluticasone (FLONASE) 50 MCG/ACT nasal spray Place 1 spray into both nostrils  daily.    [provider]  fosfomycin (MONUROL) 3 g PACK Take 3 gm by mouth every 10 days for UTI prevention Patient not taking: Reported on 07/21/2022 11/11/21   Stoneking, Danford Bad., MD  furosemide (LASIX) 20 MG tablet Take 20 mg by mouth daily. 03/12/20   [provider]  lidocaine (LIDODERM) 5 % Place 1 patch onto the skin daily. Remove & Discard patch within 12 hours or as directed by MD 07/21/22   Rondel Baton, MD  LORazepam (ATIVAN) 0.5 MG tablet Take 0.5 mg by mouth once. 09/30/21   [provider]  mirabegron ER (MYRBETRIQ) 50 MG TB24 tablet Take 1 tablet (50 mg total) by mouth daily. 11/07/21   Stoneking, Danford Bad., MD  Multiple Vitamins-Minerals (OCUVITE PO) Take by mouth.    [provider]  olmesartan (BENICAR) 40 MG tablet Take 40 mg by mouth daily. 05/25/21   [provider]  pantoprazole (PROTONIX) 40 MG tablet Take 40 mg by mouth daily. 04/25/20   [provider]  Potassium Chloride ER 20 MEQ TBCR Take 1 tablet by mouth daily. 05/26/22   [provider]  sertraline (ZOLOFT) 25 MG tablet Take 25 mg by mouth daily. 07/16/22   [provider]  vitamin B-12 (CYANOCOBALAMIN) 1000 MCG tablet Take 1,000 mcg by mouth daily.    [provider]  Allergies    Iodinated contrast media, Iodine, Nifedipine, Penicillins, Primidone, Codeine, Erythromycin, Isosorbide, Peanut oil, Fish oil, Imdur [isosorbide nitrate], and Shellfish allergy    Review of Systems   Review of Systems  Physical Exam Updated Vital Signs BP (!) 175/65   Pulse (!) 58   Temp 98.3 F (36.8 C) (Oral)   Resp 18   Ht 4\' 8"  (1.422 m)   SpO2 94%   BMI 31.63 kg/m  Physical Exam Vitals reviewed.  HENT:     Head: Atraumatic.  Cardiovascular:     Rate and Rhythm: Regular rhythm.  Abdominal:     Tenderness: There is no abdominal tenderness.  Musculoskeletal:     Cervical back: Neck supple. No tenderness.     Comments: Mild  tenderness to right hip laterally but good range of motion.  No lumbar tenderness.  Neurological:     Mental Status: She is alert.     Comments: Awake and pleasant but some confusion.     ED Results / Procedures / Treatments   Labs (all labs ordered are listed, but only abnormal results are displayed) Labs Reviewed  BASIC METABOLIC PANEL - Abnormal; Notable for the following components:      Result Value   GFR, Estimated 56 (*)    All other components within normal limits  CBC - Abnormal; Notable for the following components:   WBC 11.7 (*)    All other components within normal limits    EKG None  Radiology DG Hip Unilat W or Wo Pelvis 2-3 Views Right  Result Date: 12/12/2022 CLINICAL DATA:  Fall EXAM: DG HIP (WITH OR WITHOUT PELVIS) 2-3V RIGHT COMPARISON:  None Available. FINDINGS: There is no evidence of hip fracture or dislocation. Bilateral superolateral hip joint space narrowing with marginal osteophytes. Advanced degenerate disc disease of the lower lumbar spine. IMPRESSION: 1. No acute fracture or dislocation. 2. Bilateral hip osteoarthritis. Electronically Signed   By: Larose Hires D.O.   On: 12/12/2022 17:16   CT Head Wo Contrast  Result Date: 12/12/2022 CLINICAL DATA:  Multiple falls.  Head trauma. EXAM: CT HEAD WITHOUT CONTRAST TECHNIQUE: Contiguous axial images were obtained from the base of the skull through the vertex without intravenous contrast. RADIATION DOSE REDUCTION: This exam was performed according to the departmental dose-optimization program which includes automated exposure control, adjustment of the mA and/or kV according to patient size and/or use of iterative reconstruction technique. COMPARISON:  CT examination dated Nov 26, 2022 FINDINGS: Brain: No evidence of acute infarction, hemorrhage, hydrocephalus, extra-axial collection or mass lesion/mass effect. Prominence of the ventricles and sulci secondary to moderate generalized cerebral atrophy. Diffuse  low-attenuation of the periventricular and subcortical white matter presumed chronic microvascular ischemic changes. Vascular: No hyperdense vessel or unexpected calcification. Skull: Normal. Negative for fracture or focal lesion. Sinuses/Orbits: No acute finding. Other: None. IMPRESSION: 1. No acute intracranial abnormality. 2. Moderate generalized cerebral atrophy and chronic microvascular ischemic changes of the white matter. Electronically Signed   By: Larose Hires D.O.   On: 12/12/2022 17:14    Procedures Procedures    Medications Ordered in ED Medications - No data to display  ED Course/ Medical Decision Making/ A&P                             Medical Decision Making Amount and/or Complexity of Data Reviewed Labs: ordered. Radiology: ordered.   Patient presents after fall.  Unknown history.  Complaining of hitting  her head and right hip pain.  Overall benign exam.  Will get some basic blood work to evaluate for cause of general weakness.  Also head CT and right hip x-ray.  Does have hypertension but unsure about taking medicine.  No chest pain or trouble breathing.  Imaging reassuring.  Blood work reassuring.  Does have some hypertension but can follow as an outpatient.  Discussed with patient's son and she is reportedly at baseline.        Final Clinical Impression(s) / ED Diagnoses Final diagnoses:  Fall, initial encounter  Hypertension, unspecified type  Pain of right hip    Rx / DC Orders ED Discharge Orders     None         Benjiman Core, MD 12/12/22 2353

## 2022-12-15 DIAGNOSIS — M6281 Muscle weakness (generalized): Secondary | ICD-10-CM | POA: Diagnosis not present

## 2022-12-15 DIAGNOSIS — I119 Hypertensive heart disease without heart failure: Secondary | ICD-10-CM | POA: Diagnosis not present

## 2022-12-15 DIAGNOSIS — K5900 Constipation, unspecified: Secondary | ICD-10-CM | POA: Diagnosis not present

## 2022-12-15 DIAGNOSIS — R54 Age-related physical debility: Secondary | ICD-10-CM | POA: Diagnosis not present

## 2022-12-15 DIAGNOSIS — F419 Anxiety disorder, unspecified: Secondary | ICD-10-CM | POA: Diagnosis not present

## 2022-12-15 DIAGNOSIS — F331 Major depressive disorder, recurrent, moderate: Secondary | ICD-10-CM | POA: Diagnosis not present

## 2022-12-22 DIAGNOSIS — F419 Anxiety disorder, unspecified: Secondary | ICD-10-CM | POA: Diagnosis not present

## 2022-12-22 DIAGNOSIS — F331 Major depressive disorder, recurrent, moderate: Secondary | ICD-10-CM | POA: Diagnosis not present

## 2022-12-22 DIAGNOSIS — G309 Alzheimer's disease, unspecified: Secondary | ICD-10-CM | POA: Diagnosis not present

## 2022-12-22 DIAGNOSIS — F0154 Vascular dementia, unspecified severity, with anxiety: Secondary | ICD-10-CM | POA: Diagnosis not present

## 2022-12-23 ENCOUNTER — Other Ambulatory Visit: Payer: Self-pay

## 2022-12-23 ENCOUNTER — Emergency Department (HOSPITAL_COMMUNITY): Payer: Medicare Other

## 2022-12-23 ENCOUNTER — Emergency Department (HOSPITAL_COMMUNITY)
Admission: EM | Admit: 2022-12-23 | Discharge: 2022-12-23 | Disposition: A | Discharge status: Home / Self Care | Payer: Medicare Other | Attending: Emergency Medicine | Admitting: Emergency Medicine

## 2022-12-23 ENCOUNTER — Encounter (HOSPITAL_COMMUNITY): Payer: Self-pay

## 2022-12-23 DIAGNOSIS — S0003XA Contusion of scalp, initial encounter: Secondary | ICD-10-CM | POA: Insufficient documentation

## 2022-12-23 DIAGNOSIS — Z043 Encounter for examination and observation following other accident: Secondary | ICD-10-CM | POA: Diagnosis not present

## 2022-12-23 DIAGNOSIS — R519 Headache, unspecified: Secondary | ICD-10-CM | POA: Diagnosis not present

## 2022-12-23 DIAGNOSIS — W19XXXA Unspecified fall, initial encounter: Secondary | ICD-10-CM | POA: Insufficient documentation

## 2022-12-23 DIAGNOSIS — S0990XA Unspecified injury of head, initial encounter: Secondary | ICD-10-CM

## 2022-12-23 DIAGNOSIS — I10 Essential (primary) hypertension: Secondary | ICD-10-CM | POA: Diagnosis not present

## 2022-12-23 DIAGNOSIS — Z91010 Allergy to peanuts: Secondary | ICD-10-CM | POA: Diagnosis not present

## 2022-12-23 DIAGNOSIS — F0390 Unspecified dementia without behavioral disturbance: Secondary | ICD-10-CM | POA: Insufficient documentation

## 2022-12-23 DIAGNOSIS — M545 Low back pain, unspecified: Secondary | ICD-10-CM

## 2022-12-23 NOTE — ED Provider Notes (Signed)
Janice [name]   CSN: [id number] Arrival date & time: 12/23/22  1406     History  Chief Complaint  Patient presents with   [facility] is a 89 y.o. female.   Fall   This patient is a 89 year old female, there is some history of dementia, she currently lives at [facility] facility where she presents after having a fall that was witnessed by staff, she had bumped her head against a table and fell down to the floor landing on her bottom.  She has complaints of pain in her lower back and her head.  There is no loss consciousness no seizures no nausea or vomiting, the patient cannot give me any other history.    Home Medications Prior to Admission medications   Medication Sig Start Date End Date Taking? Authorizing Provider  acetaminophen (TYLENOL) 325 MG tablet Take 650 mg by mouth every 6 (six) hours as needed.    [provider]  alendronate (FOSAMAX) 70 MG tablet Take 70 mg by mouth once a week. 05/28/20   [provider]  amLODipine (NORVASC) 5 MG tablet Take 5 mg by mouth daily. 07/16/22   [provider]  atenolol (TENORMIN) 50 MG tablet Take 50 mg by mouth daily. 03/18/20   [provider]  Cholecalciferol (VITAMIN D-3) 25 MCG (1000 UT) CAPS Take 1 capsule by mouth daily.    [provider]  cinacalcet (SENSIPAR) 30 MG tablet Take 1 tablet (30 mg total) by mouth daily with breakfast. 10/27/22   Nida, Denman George, MD  CRANBERRY CONCENTRATE PO Take 1 tablet by mouth daily.    [provider]  diclofenac Sodium (VOLTAREN) 1 % GEL Apply 2 g topically 2 (two) times daily.    [provider]  fenofibrate 54 MG tablet Take 1 tablet by mouth daily. 04/23/20   [provider]  fluticasone (FLONASE) 50 MCG/ACT nasal spray Place 1 spray into both nostrils daily.    [provider]  fosfomycin (MONUROL) 3 g PACK Take 3 gm by  mouth every 10 days for UTI prevention Patient not taking: Reported on 07/21/2022 11/11/21   Stoneking, Danford Bad., MD  furosemide (LASIX) 20 MG tablet Take 20 mg by mouth daily. 03/12/20   [provider]  lidocaine (LIDODERM) 5 % Place 1 patch onto the skin daily. Remove & Discard patch within 12 hours or as directed by MD 07/21/22   Rondel Baton, MD  LORazepam (ATIVAN) 0.5 MG tablet Take 0.5 mg by mouth once. 09/30/21   [provider]  mirabegron ER (MYRBETRIQ) 50 MG TB24 tablet Take 1 tablet (50 mg total) by mouth daily. 11/07/21   Stoneking, Danford Bad., MD  Multiple Vitamins-Minerals (OCUVITE PO) Take by mouth.    [provider]  olmesartan (BENICAR) 40 MG tablet Take 40 mg by mouth daily. 05/25/21   [provider]  pantoprazole (PROTONIX) 40 MG tablet Take 40 mg by mouth daily. 04/25/20   [provider]  Potassium Chloride ER 20 MEQ TBCR Take 1 tablet by mouth daily. 05/26/22   [provider]  sertraline (ZOLOFT) 25 MG tablet Take 25 mg by mouth daily. 07/16/22   [provider]  vitamin B-12 (CYANOCOBALAMIN) 1000 MCG tablet Take 1,000 mcg by mouth daily.    [provider]      Allergies    Iodinated contrast media, Iodine, Nifedipine, Penicillins, Primidone, Codeine, Erythromycin, Isosorbide, Peanut  oil, Fish oil, Imdur [isosorbide nitrate], and Shellfish allergy    Review of Systems   Review of Systems  All other systems reviewed and are negative.   Physical Exam Updated Vital Signs BP (!) 181/78   Pulse 61   Temp 97.9 F (36.6 C)   Resp 20   Ht 1.422 m (4\' 8" )   Wt 64 kg   SpO2 90%   BMI 31.63 kg/m  Physical Exam Vitals and nursing [name] reviewed.  Constitutional:      General: She is not in acute distress.    Appearance: She is well-developed.  HENT:     Head: Normocephalic.     Comments: 2 cm hematoma to the left posterior occiput    Mouth/Throat:     Pharynx: No oropharyngeal exudate.   Eyes:     General: No scleral icterus.       Right eye: No discharge.        Left eye: No discharge.     Conjunctiva/sclera: Conjunctivae normal.     Pupils: Pupils are equal, round, and reactive to light.  Neck:     Thyroid: No thyromegaly.     Vascular: No JVD.  Cardiovascular:     Rate and Rhythm: Normal rate and regular rhythm.     Heart sounds: Normal heart sounds. No murmur heard.    No friction rub. No gallop.  Pulmonary:     Effort: Pulmonary effort is normal. No respiratory distress.     Breath sounds: Normal breath sounds. No wheezing or rales.  Abdominal:     General: Bowel sounds are normal. There is no distension.     Palpations: Abdomen is soft. There is no mass.     Tenderness: There is no abdominal tenderness.  Musculoskeletal:        General: No tenderness. Normal range of motion.     Cervical back: Normal range of motion and neck supple.  Lymphadenopathy:     Cervical: No cervical adenopathy.  Skin:    General: Skin is warm and dry.     Findings: No erythema or rash.  Neurological:     Mental Status: She is alert.     Coordination: Coordination normal.     Comments: Awake and alert, able to move all 4 extremities, she can straight leg raise bilaterally without difficulty.  There is no pain with range of motion of the hips knees ankles shoulders elbows or wrists.  No signs of deformity of the distal radius bilaterally no tenderness over the cervical thoracic or lumbar spines  Psychiatric:        Behavior: Behavior normal.     ED Results / Procedures / Treatments   Labs (all labs ordered are listed, but only abnormal results are displayed) Labs Reviewed - No data to display  EKG None  Radiology CT Head Wo Contrast  Result Date: 12/23/2022 CLINICAL DATA:  Polytrauma, blunt EXAM: CT HEAD WITHOUT CONTRAST TECHNIQUE: Contiguous axial images were obtained from the base of the skull through the vertex without intravenous contrast. RADIATION DOSE REDUCTION:  This exam was performed according to the departmental dose-optimization program which includes automated exposure control, adjustment of the mA and/or kV according to patient size and/or use of iterative reconstruction technique. COMPARISON:  CT head 12/12/22, CT head 10/27/22 FINDINGS: Brain: No evidence of acute infarction, hemorrhage, hydrocephalus, extra-axial collection or mass lesion/mass effect. Sequela of moderate chronic microvascular ischemic change with advanced generalized volume loss. There may be a small chronic extra-axial collection  along the right frontal convexity (series 4, image 35). This likely present on prior head dated 12/12/2022 and is new compared to prior CT dated 10/27/2022. Vascular: No hyperdense vessel or unexpected calcification. Skull: Soft tissue injury of the left parietal scalp. No evidence of underlying calvarial fracture. Sinuses/Orbits: No middle ear or mastoid effusion. Frothy secretions in the left sphenoid sinus. Bilateral lens replacement. Orbits are otherwise unremarkable. Other: None. IMPRESSION: 1. No acute intracranial abnormality. 2. Chronic appearing extra-axial collection along the right convexity, new compared to 10/27/2022 and unchanged compared to 12/12/2022. 3. Soft tissue injury of the left parietal scalp. No evidence of underlying calvarial fracture. 4. Frothy secretions in the left sphenoid sinus. Correlate for symptoms of acute sinusitis. Electronically Signed   By: Lorenza Cambridge M.D.   On: 12/23/2022 15:28   DG Lumbar Spine Complete  Result Date: 12/23/2022 CLINICAL DATA:  trauma, fall EXAM: LUMBAR SPINE - COMPLETE 4 VIEW COMPARISON:  None Available. FINDINGS: Assessment is markedly limited due to the degree of degenerative change and levocurvature lumbar spine. There are multilevel severe degenerative changes with severe disc space loss at the L1-L2, L2-L3, and L3-L4 levels. Vertebral body heights are maintained. Assessment of the sacrum is slightly limited  due to overlying bowel gas. Symmetric SI joints. IMPRESSION: Assessment is markedly limited due to the degree of degenerative change and levocurvature of the lumbar spine. Within this limitation, no acute fracture or traumatic listhesis. Severe degenerative changes at the thoracolumbar junction. If there is high clinical concern for an acute fracture in the lumbar spine, further evaluation with a CT is recommended. Electronically Signed   By: Lorenza Cambridge M.D.   On: 12/23/2022 15:23    Procedures Procedures    Medications Ordered in ED Medications - No data to display  ED Course/ Medical Decision Making/ A&P                             Medical Decision Making Amount and/or Complexity of Data Reviewed Radiology: ordered.   Patient is awake and alert with no obvious signs of trauma other than a small hematoma to the head.  Vital signs reviewed, mildly hypertensive, mildly bradycardic, otherwise totally well-appearing without any significant or severe signs of trauma or neurologic decompensation.  Will proceed with CT scan of the head and lumbar spine x-rays, anticipate discharge pending imaging.  CT scan unchanged from prior, small extra-axial fluid collection but no increase in size from 1 week ago.  Lumbar spine x-rays without any acute findings,  Vital signs reflect some hypertension but no tachycardia no fever no hypoxia, she is 95% on room air and well-appearing without any complaints at this time.  This patient is pleasantly confused but does not appear to need inpatient treatment, she is definitely stable for discharge in the outpatient setting.         Final Clinical Impression(s) / ED Diagnoses Final diagnoses:  Fall, initial encounter  Injury of head, initial encounter  Lumbar pain    Rx / DC Orders ED Discharge Orders     None         Eber Hong, MD 12/23/22 1536

## 2022-12-23 NOTE — Discharge Instructions (Addendum)
The testing is unchanged from the last CAT scan that she had last week.  There is no signs of new injuries, no signs of broken bones in her back, please try to avoid falling, take your medications exactly as prescribed including all of your blood pressure medications.  Emergency department for severe or worsening symptoms  Please have your family doctor, or the medical provider covering your nursing facility recheck your blood pressure within 2 days

## 2022-12-23 NOTE — ED Notes (Signed)
Patient transported to X-ray 

## 2022-12-23 NOTE — ED Triage Notes (Signed)
Patient fell from a standing position. Hit back of head on a chair or table. Complains of pain in the back of her head and lower back pain

## 2022-12-28 DIAGNOSIS — M6281 Muscle weakness (generalized): Secondary | ICD-10-CM | POA: Diagnosis not present

## 2022-12-28 DIAGNOSIS — F0280 Dementia in other diseases classified elsewhere without behavioral disturbance: Secondary | ICD-10-CM | POA: Diagnosis not present

## 2022-12-28 DIAGNOSIS — N390 Urinary tract infection, site not specified: Secondary | ICD-10-CM | POA: Diagnosis not present

## 2022-12-28 DIAGNOSIS — M545 Low back pain, unspecified: Secondary | ICD-10-CM | POA: Diagnosis not present

## 2022-12-28 DIAGNOSIS — R296 Repeated falls: Secondary | ICD-10-CM | POA: Diagnosis not present

## 2022-12-28 DIAGNOSIS — K219 Gastro-esophageal reflux disease without esophagitis: Secondary | ICD-10-CM | POA: Diagnosis not present

## 2022-12-29 DIAGNOSIS — F331 Major depressive disorder, recurrent, moderate: Secondary | ICD-10-CM | POA: Diagnosis not present

## 2022-12-29 DIAGNOSIS — F419 Anxiety disorder, unspecified: Secondary | ICD-10-CM | POA: Diagnosis not present

## 2023-01-01 ENCOUNTER — Emergency Department (HOSPITAL_COMMUNITY)
Admission: EM | Admit: 2023-01-01 | Discharge: 2023-01-02 | Disposition: A | Discharge status: Home / Self Care | Payer: Medicare Other | Attending: Emergency Medicine | Admitting: Emergency Medicine

## 2023-01-01 ENCOUNTER — Encounter (HOSPITAL_COMMUNITY): Payer: Self-pay | Admitting: *Deleted

## 2023-01-01 ENCOUNTER — Other Ambulatory Visit: Payer: Self-pay

## 2023-01-01 ENCOUNTER — Emergency Department (HOSPITAL_COMMUNITY): Payer: Medicare Other

## 2023-01-01 DIAGNOSIS — S3991XA Unspecified injury of abdomen, initial encounter: Secondary | ICD-10-CM | POA: Diagnosis not present

## 2023-01-01 DIAGNOSIS — S3992XA Unspecified injury of lower back, initial encounter: Secondary | ICD-10-CM | POA: Diagnosis not present

## 2023-01-01 DIAGNOSIS — S199XXA Unspecified injury of neck, initial encounter: Secondary | ICD-10-CM | POA: Diagnosis not present

## 2023-01-01 DIAGNOSIS — W01198A Fall on same level from slipping, tripping and stumbling with subsequent striking against other object, initial encounter: Secondary | ICD-10-CM | POA: Diagnosis not present

## 2023-01-01 DIAGNOSIS — I6200 Nontraumatic subdural hemorrhage, unspecified: Secondary | ICD-10-CM | POA: Diagnosis not present

## 2023-01-01 DIAGNOSIS — S065XAA Traumatic subdural hemorrhage with loss of consciousness status unknown, initial encounter: Secondary | ICD-10-CM

## 2023-01-01 DIAGNOSIS — I10 Essential (primary) hypertension: Secondary | ICD-10-CM | POA: Insufficient documentation

## 2023-01-01 DIAGNOSIS — N200 Calculus of kidney: Secondary | ICD-10-CM | POA: Diagnosis not present

## 2023-01-01 DIAGNOSIS — M549 Dorsalgia, unspecified: Secondary | ICD-10-CM | POA: Diagnosis not present

## 2023-01-01 DIAGNOSIS — Z79899 Other long term (current) drug therapy: Secondary | ICD-10-CM | POA: Insufficient documentation

## 2023-01-01 DIAGNOSIS — I2510 Atherosclerotic heart disease of native coronary artery without angina pectoris: Secondary | ICD-10-CM | POA: Diagnosis not present

## 2023-01-01 DIAGNOSIS — I700 Atherosclerosis of aorta: Secondary | ICD-10-CM | POA: Diagnosis not present

## 2023-01-01 DIAGNOSIS — M545 Low back pain, unspecified: Secondary | ICD-10-CM | POA: Insufficient documentation

## 2023-01-01 DIAGNOSIS — S065X0A Traumatic subdural hemorrhage without loss of consciousness, initial encounter: Secondary | ICD-10-CM | POA: Insufficient documentation

## 2023-01-01 DIAGNOSIS — S0990XA Unspecified injury of head, initial encounter: Secondary | ICD-10-CM | POA: Diagnosis not present

## 2023-01-01 DIAGNOSIS — R4182 Altered mental status, unspecified: Secondary | ICD-10-CM | POA: Diagnosis not present

## 2023-01-01 DIAGNOSIS — R079 Chest pain, unspecified: Secondary | ICD-10-CM | POA: Diagnosis not present

## 2023-01-01 DIAGNOSIS — Y9301 Activity, walking, marching and hiking: Secondary | ICD-10-CM | POA: Insufficient documentation

## 2023-01-01 DIAGNOSIS — W19XXXA Unspecified fall, initial encounter: Secondary | ICD-10-CM | POA: Diagnosis not present

## 2023-01-01 MED ORDER — HYDRALAZINE HCL 25 MG PO TABS
25.0000 mg | ORAL_TABLET | Freq: Once | ORAL | Status: AC
  Administered 2023-01-01: 25 mg via ORAL
  Filled 2023-01-01: qty 1

## 2023-01-01 MED ORDER — ACETAMINOPHEN 500 MG PO TABS
1000.0000 mg | ORAL_TABLET | Freq: Once | ORAL | Status: AC
  Administered 2023-01-01: 1000 mg via ORAL
  Filled 2023-01-01: qty 2

## 2023-01-01 NOTE — ED Notes (Signed)
Pt is hypertensive and per ems this is her baseline.  EMS VS 194/70 hr 62 96% ra
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
## 2023-01-01 NOTE — ED Notes (Signed)
Pt changed out of her clothing, she had been incontinent of stool.  Changed out of depends and cleaned pt.  Pt is red in diaper area, especially in gluteal fold, cleaned with warm soapy water and patted dry, placed pt on bedpan to void.

## 2023-01-01 NOTE — ED Triage Notes (Signed)
Pt arrives by ems for fall at her memory care facility.  Pt reports that her "foot wouldn't go the way I wanted to go" Witnessed fall while ambulating.  Fell back and struck back of her head.  No LOC, not on any blood thinners.  Pt reports lower back pain.  Pt is oriented to self.

## 2023-01-02 ENCOUNTER — Emergency Department (HOSPITAL_COMMUNITY): Payer: Medicare Other

## 2023-01-02 DIAGNOSIS — I6200 Nontraumatic subdural hemorrhage, unspecified: Secondary | ICD-10-CM | POA: Diagnosis not present

## 2023-01-02 DIAGNOSIS — S065X0A Traumatic subdural hemorrhage without loss of consciousness, initial encounter: Secondary | ICD-10-CM | POA: Diagnosis not present

## 2023-01-02 DIAGNOSIS — S3991XA Unspecified injury of abdomen, initial encounter: Secondary | ICD-10-CM | POA: Diagnosis not present

## 2023-01-02 DIAGNOSIS — R4182 Altered mental status, unspecified: Secondary | ICD-10-CM | POA: Diagnosis not present

## 2023-01-02 DIAGNOSIS — R079 Chest pain, unspecified: Secondary | ICD-10-CM | POA: Diagnosis not present

## 2023-01-02 MED ORDER — ACETAMINOPHEN 325 MG PO TABS
650.0000 mg | ORAL_TABLET | Freq: Once | ORAL | Status: AC
  Administered 2023-01-02: 650 mg via ORAL
  Filled 2023-01-02: qty 2

## 2023-01-02 NOTE — ED Notes (Signed)
Patient transported to CT 

## 2023-01-02 NOTE — ED Notes (Signed)
Pt returned from CT Scan 

## 2023-01-02 NOTE — ED Provider Notes (Signed)
CT head unchanged Pt moaning in pain, states her abdomen and side hurt She has diffuse ABD tenderness Will order CXR and CT abd/pelvis    Zadie Rhine, MD 01/02/23 [phone]

## 2023-01-02 NOTE — ED Provider Notes (Signed)
Additional imaging is unremarkable.  Patient resting comfortably.  She will be discharged back to facility.  I attempted to call family that was listed, but it went straight to voicemail.   Zadie Rhine, MD 01/02/23 [phone]

## 2023-01-02 NOTE — ED Notes (Addendum)
All belongings and report given to [facility].  Phone and alarm bracelet in Pt Belongings bag.  Report previously given to facility.

## 2023-01-04 DIAGNOSIS — N390 Urinary tract infection, site not specified: Secondary | ICD-10-CM | POA: Diagnosis not present

## 2023-01-04 DIAGNOSIS — R54 Age-related physical debility: Secondary | ICD-10-CM | POA: Diagnosis not present

## 2023-01-04 DIAGNOSIS — S065XAA Traumatic subdural hemorrhage with loss of consciousness status unknown, initial encounter: Secondary | ICD-10-CM | POA: Diagnosis not present

## 2023-01-04 DIAGNOSIS — R296 Repeated falls: Secondary | ICD-10-CM | POA: Diagnosis not present

## 2023-01-05 DIAGNOSIS — J302 Other seasonal allergic rhinitis: Secondary | ICD-10-CM | POA: Diagnosis not present

## 2023-01-05 DIAGNOSIS — N3281 Overactive bladder: Secondary | ICD-10-CM | POA: Diagnosis not present

## 2023-01-05 DIAGNOSIS — M810 Age-related osteoporosis without current pathological fracture: Secondary | ICD-10-CM | POA: Diagnosis not present

## 2023-01-05 DIAGNOSIS — Z87440 Personal history of urinary (tract) infections: Secondary | ICD-10-CM | POA: Diagnosis not present

## 2023-01-05 DIAGNOSIS — R634 Abnormal weight loss: Secondary | ICD-10-CM | POA: Diagnosis not present

## 2023-01-05 DIAGNOSIS — E213 Hyperparathyroidism, unspecified: Secondary | ICD-10-CM | POA: Diagnosis not present

## 2023-01-05 DIAGNOSIS — S065X0D Traumatic subdural hemorrhage without loss of consciousness, subsequent encounter: Secondary | ICD-10-CM | POA: Diagnosis not present

## 2023-01-05 DIAGNOSIS — E785 Hyperlipidemia, unspecified: Secondary | ICD-10-CM | POA: Diagnosis not present

## 2023-01-05 DIAGNOSIS — F0390 Unspecified dementia without behavioral disturbance: Secondary | ICD-10-CM | POA: Diagnosis not present

## 2023-01-05 DIAGNOSIS — F331 Major depressive disorder, recurrent, moderate: Secondary | ICD-10-CM | POA: Diagnosis not present

## 2023-01-06 DIAGNOSIS — N3281 Overactive bladder: Secondary | ICD-10-CM | POA: Diagnosis not present

## 2023-01-06 DIAGNOSIS — E785 Hyperlipidemia, unspecified: Secondary | ICD-10-CM | POA: Diagnosis not present

## 2023-01-06 DIAGNOSIS — S065X0D Traumatic subdural hemorrhage without loss of consciousness, subsequent encounter: Secondary | ICD-10-CM | POA: Diagnosis not present

## 2023-01-06 DIAGNOSIS — E213 Hyperparathyroidism, unspecified: Secondary | ICD-10-CM | POA: Diagnosis not present

## 2023-01-06 DIAGNOSIS — R634 Abnormal weight loss: Secondary | ICD-10-CM | POA: Diagnosis not present

## 2023-01-06 DIAGNOSIS — F0390 Unspecified dementia without behavioral disturbance: Secondary | ICD-10-CM | POA: Diagnosis not present

## 2023-01-07 DIAGNOSIS — R634 Abnormal weight loss: Secondary | ICD-10-CM | POA: Diagnosis not present

## 2023-01-07 DIAGNOSIS — N3281 Overactive bladder: Secondary | ICD-10-CM | POA: Diagnosis not present

## 2023-01-07 DIAGNOSIS — E785 Hyperlipidemia, unspecified: Secondary | ICD-10-CM | POA: Diagnosis not present

## 2023-01-07 DIAGNOSIS — F0390 Unspecified dementia without behavioral disturbance: Secondary | ICD-10-CM | POA: Diagnosis not present

## 2023-01-07 DIAGNOSIS — E213 Hyperparathyroidism, unspecified: Secondary | ICD-10-CM | POA: Diagnosis not present

## 2023-01-07 DIAGNOSIS — S065X0D Traumatic subdural hemorrhage without loss of consciousness, subsequent encounter: Secondary | ICD-10-CM | POA: Diagnosis not present

## 2023-01-08 DIAGNOSIS — R54 Age-related physical debility: Secondary | ICD-10-CM | POA: Diagnosis not present

## 2023-01-08 DIAGNOSIS — F419 Anxiety disorder, unspecified: Secondary | ICD-10-CM | POA: Diagnosis not present

## 2023-01-11 DIAGNOSIS — E213 Hyperparathyroidism, unspecified: Secondary | ICD-10-CM | POA: Diagnosis not present

## 2023-01-11 DIAGNOSIS — T7411XA Adult physical abuse, confirmed, initial encounter: Secondary | ICD-10-CM | POA: Diagnosis not present

## 2023-01-11 DIAGNOSIS — N3281 Overactive bladder: Secondary | ICD-10-CM | POA: Diagnosis not present

## 2023-01-11 DIAGNOSIS — F0390 Unspecified dementia without behavioral disturbance: Secondary | ICD-10-CM | POA: Diagnosis not present

## 2023-01-11 DIAGNOSIS — R634 Abnormal weight loss: Secondary | ICD-10-CM | POA: Diagnosis not present

## 2023-01-11 DIAGNOSIS — S065X0D Traumatic subdural hemorrhage without loss of consciousness, subsequent encounter: Secondary | ICD-10-CM | POA: Diagnosis not present

## 2023-01-11 DIAGNOSIS — I119 Hypertensive heart disease without heart failure: Secondary | ICD-10-CM | POA: Diagnosis not present

## 2023-01-11 DIAGNOSIS — K5900 Constipation, unspecified: Secondary | ICD-10-CM | POA: Diagnosis not present

## 2023-01-11 DIAGNOSIS — E785 Hyperlipidemia, unspecified: Secondary | ICD-10-CM | POA: Diagnosis not present

## 2023-01-11 DIAGNOSIS — N390 Urinary tract infection, site not specified: Secondary | ICD-10-CM | POA: Diagnosis not present

## 2023-01-12 DIAGNOSIS — F331 Major depressive disorder, recurrent, moderate: Secondary | ICD-10-CM | POA: Diagnosis not present

## 2023-01-13 DIAGNOSIS — E785 Hyperlipidemia, unspecified: Secondary | ICD-10-CM | POA: Diagnosis not present

## 2023-01-13 DIAGNOSIS — N3281 Overactive bladder: Secondary | ICD-10-CM | POA: Diagnosis not present

## 2023-01-13 DIAGNOSIS — R634 Abnormal weight loss: Secondary | ICD-10-CM | POA: Diagnosis not present

## 2023-01-13 DIAGNOSIS — F0390 Unspecified dementia without behavioral disturbance: Secondary | ICD-10-CM | POA: Diagnosis not present

## 2023-01-13 DIAGNOSIS — E213 Hyperparathyroidism, unspecified: Secondary | ICD-10-CM | POA: Diagnosis not present

## 2023-01-13 DIAGNOSIS — S065X0D Traumatic subdural hemorrhage without loss of consciousness, subsequent encounter: Secondary | ICD-10-CM | POA: Diagnosis not present

## 2023-01-14 DIAGNOSIS — E213 Hyperparathyroidism, unspecified: Secondary | ICD-10-CM | POA: Diagnosis not present

## 2023-01-14 DIAGNOSIS — E785 Hyperlipidemia, unspecified: Secondary | ICD-10-CM | POA: Diagnosis not present

## 2023-01-14 DIAGNOSIS — R634 Abnormal weight loss: Secondary | ICD-10-CM | POA: Diagnosis not present

## 2023-01-14 DIAGNOSIS — N3281 Overactive bladder: Secondary | ICD-10-CM | POA: Diagnosis not present

## 2023-01-14 DIAGNOSIS — S065X0D Traumatic subdural hemorrhage without loss of consciousness, subsequent encounter: Secondary | ICD-10-CM | POA: Diagnosis not present

## 2023-01-14 DIAGNOSIS — F0390 Unspecified dementia without behavioral disturbance: Secondary | ICD-10-CM | POA: Diagnosis not present

## 2023-01-15 DIAGNOSIS — M810 Age-related osteoporosis without current pathological fracture: Secondary | ICD-10-CM | POA: Diagnosis not present

## 2023-01-15 DIAGNOSIS — I872 Venous insufficiency (chronic) (peripheral): Secondary | ICD-10-CM | POA: Diagnosis not present

## 2023-01-15 DIAGNOSIS — I10 Essential (primary) hypertension: Secondary | ICD-10-CM | POA: Diagnosis not present

## 2023-01-18 DIAGNOSIS — S065X0D Traumatic subdural hemorrhage without loss of consciousness, subsequent encounter: Secondary | ICD-10-CM | POA: Diagnosis not present

## 2023-01-18 DIAGNOSIS — R634 Abnormal weight loss: Secondary | ICD-10-CM | POA: Diagnosis not present

## 2023-01-18 DIAGNOSIS — F0390 Unspecified dementia without behavioral disturbance: Secondary | ICD-10-CM | POA: Diagnosis not present

## 2023-01-18 DIAGNOSIS — E213 Hyperparathyroidism, unspecified: Secondary | ICD-10-CM | POA: Diagnosis not present

## 2023-01-18 DIAGNOSIS — J302 Other seasonal allergic rhinitis: Secondary | ICD-10-CM | POA: Diagnosis not present

## 2023-01-18 DIAGNOSIS — N3281 Overactive bladder: Secondary | ICD-10-CM | POA: Diagnosis not present

## 2023-01-18 DIAGNOSIS — M810 Age-related osteoporosis without current pathological fracture: Secondary | ICD-10-CM | POA: Diagnosis not present

## 2023-01-18 DIAGNOSIS — E785 Hyperlipidemia, unspecified: Secondary | ICD-10-CM | POA: Diagnosis not present

## 2023-01-18 DIAGNOSIS — Z87440 Personal history of urinary (tract) infections: Secondary | ICD-10-CM | POA: Diagnosis not present

## 2023-01-19 DIAGNOSIS — F331 Major depressive disorder, recurrent, moderate: Secondary | ICD-10-CM | POA: Diagnosis not present

## 2023-01-20 DIAGNOSIS — F0390 Unspecified dementia without behavioral disturbance: Secondary | ICD-10-CM | POA: Diagnosis not present

## 2023-01-20 DIAGNOSIS — N3281 Overactive bladder: Secondary | ICD-10-CM | POA: Diagnosis not present

## 2023-01-20 DIAGNOSIS — R634 Abnormal weight loss: Secondary | ICD-10-CM | POA: Diagnosis not present

## 2023-01-20 DIAGNOSIS — E213 Hyperparathyroidism, unspecified: Secondary | ICD-10-CM | POA: Diagnosis not present

## 2023-01-20 DIAGNOSIS — S065X0D Traumatic subdural hemorrhage without loss of consciousness, subsequent encounter: Secondary | ICD-10-CM | POA: Diagnosis not present

## 2023-01-20 DIAGNOSIS — E785 Hyperlipidemia, unspecified: Secondary | ICD-10-CM | POA: Diagnosis not present

## 2023-01-21 DIAGNOSIS — F0390 Unspecified dementia without behavioral disturbance: Secondary | ICD-10-CM | POA: Diagnosis not present

## 2023-01-21 DIAGNOSIS — E785 Hyperlipidemia, unspecified: Secondary | ICD-10-CM | POA: Diagnosis not present

## 2023-01-21 DIAGNOSIS — E213 Hyperparathyroidism, unspecified: Secondary | ICD-10-CM | POA: Diagnosis not present

## 2023-01-21 DIAGNOSIS — N3281 Overactive bladder: Secondary | ICD-10-CM | POA: Diagnosis not present

## 2023-01-21 DIAGNOSIS — R634 Abnormal weight loss: Secondary | ICD-10-CM | POA: Diagnosis not present

## 2023-01-21 DIAGNOSIS — S065X0D Traumatic subdural hemorrhage without loss of consciousness, subsequent encounter: Secondary | ICD-10-CM | POA: Diagnosis not present

## 2023-01-25 DIAGNOSIS — E213 Hyperparathyroidism, unspecified: Secondary | ICD-10-CM | POA: Diagnosis not present

## 2023-01-25 DIAGNOSIS — S065X0D Traumatic subdural hemorrhage without loss of consciousness, subsequent encounter: Secondary | ICD-10-CM | POA: Diagnosis not present

## 2023-01-25 DIAGNOSIS — N3281 Overactive bladder: Secondary | ICD-10-CM | POA: Diagnosis not present

## 2023-01-25 DIAGNOSIS — E785 Hyperlipidemia, unspecified: Secondary | ICD-10-CM | POA: Diagnosis not present

## 2023-01-25 DIAGNOSIS — R634 Abnormal weight loss: Secondary | ICD-10-CM | POA: Diagnosis not present

## 2023-01-25 DIAGNOSIS — F0390 Unspecified dementia without behavioral disturbance: Secondary | ICD-10-CM | POA: Diagnosis not present

## 2023-01-26 DIAGNOSIS — F331 Major depressive disorder, recurrent, moderate: Secondary | ICD-10-CM | POA: Diagnosis not present

## 2023-01-27 DIAGNOSIS — E785 Hyperlipidemia, unspecified: Secondary | ICD-10-CM | POA: Diagnosis not present

## 2023-01-27 DIAGNOSIS — R634 Abnormal weight loss: Secondary | ICD-10-CM | POA: Diagnosis not present

## 2023-01-27 DIAGNOSIS — F0390 Unspecified dementia without behavioral disturbance: Secondary | ICD-10-CM | POA: Diagnosis not present

## 2023-01-27 DIAGNOSIS — N3281 Overactive bladder: Secondary | ICD-10-CM | POA: Diagnosis not present

## 2023-01-27 DIAGNOSIS — E213 Hyperparathyroidism, unspecified: Secondary | ICD-10-CM | POA: Diagnosis not present

## 2023-01-27 DIAGNOSIS — S065X0D Traumatic subdural hemorrhage without loss of consciousness, subsequent encounter: Secondary | ICD-10-CM | POA: Diagnosis not present

## 2023-01-28 DIAGNOSIS — E213 Hyperparathyroidism, unspecified: Secondary | ICD-10-CM | POA: Diagnosis not present

## 2023-01-28 DIAGNOSIS — S065X0D Traumatic subdural hemorrhage without loss of consciousness, subsequent encounter: Secondary | ICD-10-CM | POA: Diagnosis not present

## 2023-01-28 DIAGNOSIS — R634 Abnormal weight loss: Secondary | ICD-10-CM | POA: Diagnosis not present

## 2023-01-28 DIAGNOSIS — F0390 Unspecified dementia without behavioral disturbance: Secondary | ICD-10-CM | POA: Diagnosis not present

## 2023-01-28 DIAGNOSIS — E785 Hyperlipidemia, unspecified: Secondary | ICD-10-CM | POA: Diagnosis not present

## 2023-01-28 DIAGNOSIS — N3281 Overactive bladder: Secondary | ICD-10-CM | POA: Diagnosis not present

## 2023-02-03 DIAGNOSIS — N3281 Overactive bladder: Secondary | ICD-10-CM | POA: Diagnosis not present

## 2023-02-03 DIAGNOSIS — R634 Abnormal weight loss: Secondary | ICD-10-CM | POA: Diagnosis not present

## 2023-02-03 DIAGNOSIS — E785 Hyperlipidemia, unspecified: Secondary | ICD-10-CM | POA: Diagnosis not present

## 2023-02-03 DIAGNOSIS — E213 Hyperparathyroidism, unspecified: Secondary | ICD-10-CM | POA: Diagnosis not present

## 2023-02-03 DIAGNOSIS — F0390 Unspecified dementia without behavioral disturbance: Secondary | ICD-10-CM | POA: Diagnosis not present

## 2023-02-03 DIAGNOSIS — S065X0D Traumatic subdural hemorrhage without loss of consciousness, subsequent encounter: Secondary | ICD-10-CM | POA: Diagnosis not present

## 2023-02-04 DIAGNOSIS — R634 Abnormal weight loss: Secondary | ICD-10-CM | POA: Diagnosis not present

## 2023-02-04 DIAGNOSIS — E785 Hyperlipidemia, unspecified: Secondary | ICD-10-CM | POA: Diagnosis not present

## 2023-02-04 DIAGNOSIS — E213 Hyperparathyroidism, unspecified: Secondary | ICD-10-CM | POA: Diagnosis not present

## 2023-02-04 DIAGNOSIS — S065X0D Traumatic subdural hemorrhage without loss of consciousness, subsequent encounter: Secondary | ICD-10-CM | POA: Diagnosis not present

## 2023-02-04 DIAGNOSIS — N3281 Overactive bladder: Secondary | ICD-10-CM | POA: Diagnosis not present

## 2023-02-04 DIAGNOSIS — F0390 Unspecified dementia without behavioral disturbance: Secondary | ICD-10-CM | POA: Diagnosis not present

## 2023-02-08 DIAGNOSIS — K219 Gastro-esophageal reflux disease without esophagitis: Secondary | ICD-10-CM | POA: Diagnosis not present

## 2023-02-08 DIAGNOSIS — F0390 Unspecified dementia without behavioral disturbance: Secondary | ICD-10-CM | POA: Diagnosis not present

## 2023-02-08 DIAGNOSIS — E785 Hyperlipidemia, unspecified: Secondary | ICD-10-CM | POA: Diagnosis not present

## 2023-02-08 DIAGNOSIS — E782 Mixed hyperlipidemia: Secondary | ICD-10-CM | POA: Diagnosis not present

## 2023-02-08 DIAGNOSIS — E213 Hyperparathyroidism, unspecified: Secondary | ICD-10-CM | POA: Diagnosis not present

## 2023-02-08 DIAGNOSIS — N3281 Overactive bladder: Secondary | ICD-10-CM | POA: Diagnosis not present

## 2023-02-08 DIAGNOSIS — R634 Abnormal weight loss: Secondary | ICD-10-CM | POA: Diagnosis not present

## 2023-02-08 DIAGNOSIS — M810 Age-related osteoporosis without current pathological fracture: Secondary | ICD-10-CM | POA: Diagnosis not present

## 2023-02-08 DIAGNOSIS — S065X0D Traumatic subdural hemorrhage without loss of consciousness, subsequent encounter: Secondary | ICD-10-CM | POA: Diagnosis not present

## 2023-02-09 DIAGNOSIS — B351 Tinea unguium: Secondary | ICD-10-CM | POA: Diagnosis not present

## 2023-02-09 DIAGNOSIS — M79675 Pain in left toe(s): Secondary | ICD-10-CM | POA: Diagnosis not present

## 2023-02-09 DIAGNOSIS — F331 Major depressive disorder, recurrent, moderate: Secondary | ICD-10-CM | POA: Diagnosis not present

## 2023-02-09 DIAGNOSIS — M79674 Pain in right toe(s): Secondary | ICD-10-CM | POA: Diagnosis not present

## 2023-02-10 DIAGNOSIS — E785 Hyperlipidemia, unspecified: Secondary | ICD-10-CM | POA: Diagnosis not present

## 2023-02-10 DIAGNOSIS — R634 Abnormal weight loss: Secondary | ICD-10-CM | POA: Diagnosis not present

## 2023-02-10 DIAGNOSIS — E213 Hyperparathyroidism, unspecified: Secondary | ICD-10-CM | POA: Diagnosis not present

## 2023-02-10 DIAGNOSIS — N3281 Overactive bladder: Secondary | ICD-10-CM | POA: Diagnosis not present

## 2023-02-10 DIAGNOSIS — S065X0D Traumatic subdural hemorrhage without loss of consciousness, subsequent encounter: Secondary | ICD-10-CM | POA: Diagnosis not present

## 2023-02-10 DIAGNOSIS — F0390 Unspecified dementia without behavioral disturbance: Secondary | ICD-10-CM | POA: Diagnosis not present

## 2023-02-12 DIAGNOSIS — N3281 Overactive bladder: Secondary | ICD-10-CM | POA: Diagnosis not present

## 2023-02-12 DIAGNOSIS — S065X0D Traumatic subdural hemorrhage without loss of consciousness, subsequent encounter: Secondary | ICD-10-CM | POA: Diagnosis not present

## 2023-02-12 DIAGNOSIS — E785 Hyperlipidemia, unspecified: Secondary | ICD-10-CM | POA: Diagnosis not present

## 2023-02-12 DIAGNOSIS — R634 Abnormal weight loss: Secondary | ICD-10-CM | POA: Diagnosis not present

## 2023-02-12 DIAGNOSIS — F0390 Unspecified dementia without behavioral disturbance: Secondary | ICD-10-CM | POA: Diagnosis not present

## 2023-02-12 DIAGNOSIS — E213 Hyperparathyroidism, unspecified: Secondary | ICD-10-CM | POA: Diagnosis not present

## 2023-02-17 DIAGNOSIS — S065X0D Traumatic subdural hemorrhage without loss of consciousness, subsequent encounter: Secondary | ICD-10-CM | POA: Diagnosis not present

## 2023-02-17 DIAGNOSIS — F0390 Unspecified dementia without behavioral disturbance: Secondary | ICD-10-CM | POA: Diagnosis not present

## 2023-02-17 DIAGNOSIS — E213 Hyperparathyroidism, unspecified: Secondary | ICD-10-CM | POA: Diagnosis not present

## 2023-02-17 DIAGNOSIS — N3281 Overactive bladder: Secondary | ICD-10-CM | POA: Diagnosis not present

## 2023-02-17 DIAGNOSIS — E785 Hyperlipidemia, unspecified: Secondary | ICD-10-CM | POA: Diagnosis not present

## 2023-02-17 DIAGNOSIS — R634 Abnormal weight loss: Secondary | ICD-10-CM | POA: Diagnosis not present

## 2023-02-18 DIAGNOSIS — E785 Hyperlipidemia, unspecified: Secondary | ICD-10-CM | POA: Diagnosis not present

## 2023-02-18 DIAGNOSIS — F0390 Unspecified dementia without behavioral disturbance: Secondary | ICD-10-CM | POA: Diagnosis not present

## 2023-02-18 DIAGNOSIS — M810 Age-related osteoporosis without current pathological fracture: Secondary | ICD-10-CM | POA: Diagnosis not present

## 2023-02-18 DIAGNOSIS — R634 Abnormal weight loss: Secondary | ICD-10-CM | POA: Diagnosis not present

## 2023-02-18 DIAGNOSIS — S065X0D Traumatic subdural hemorrhage without loss of consciousness, subsequent encounter: Secondary | ICD-10-CM | POA: Diagnosis not present

## 2023-02-18 DIAGNOSIS — Z87440 Personal history of urinary (tract) infections: Secondary | ICD-10-CM | POA: Diagnosis not present

## 2023-02-18 DIAGNOSIS — E213 Hyperparathyroidism, unspecified: Secondary | ICD-10-CM | POA: Diagnosis not present

## 2023-02-18 DIAGNOSIS — N3281 Overactive bladder: Secondary | ICD-10-CM | POA: Diagnosis not present

## 2023-02-18 DIAGNOSIS — J302 Other seasonal allergic rhinitis: Secondary | ICD-10-CM | POA: Diagnosis not present

## 2023-02-19 DIAGNOSIS — N3281 Overactive bladder: Secondary | ICD-10-CM | POA: Diagnosis not present

## 2023-02-19 DIAGNOSIS — E785 Hyperlipidemia, unspecified: Secondary | ICD-10-CM | POA: Diagnosis not present

## 2023-02-19 DIAGNOSIS — R634 Abnormal weight loss: Secondary | ICD-10-CM | POA: Diagnosis not present

## 2023-02-19 DIAGNOSIS — F0390 Unspecified dementia without behavioral disturbance: Secondary | ICD-10-CM | POA: Diagnosis not present

## 2023-02-19 DIAGNOSIS — S065X0D Traumatic subdural hemorrhage without loss of consciousness, subsequent encounter: Secondary | ICD-10-CM | POA: Diagnosis not present

## 2023-02-19 DIAGNOSIS — E213 Hyperparathyroidism, unspecified: Secondary | ICD-10-CM | POA: Diagnosis not present

## 2023-02-24 DIAGNOSIS — S065X0D Traumatic subdural hemorrhage without loss of consciousness, subsequent encounter: Secondary | ICD-10-CM | POA: Diagnosis not present

## 2023-02-24 DIAGNOSIS — R634 Abnormal weight loss: Secondary | ICD-10-CM | POA: Diagnosis not present

## 2023-02-24 DIAGNOSIS — F0390 Unspecified dementia without behavioral disturbance: Secondary | ICD-10-CM | POA: Diagnosis not present

## 2023-02-24 DIAGNOSIS — E785 Hyperlipidemia, unspecified: Secondary | ICD-10-CM | POA: Diagnosis not present

## 2023-02-24 DIAGNOSIS — N3281 Overactive bladder: Secondary | ICD-10-CM | POA: Diagnosis not present

## 2023-02-24 DIAGNOSIS — E213 Hyperparathyroidism, unspecified: Secondary | ICD-10-CM | POA: Diagnosis not present

## 2023-02-25 DIAGNOSIS — S065X0D Traumatic subdural hemorrhage without loss of consciousness, subsequent encounter: Secondary | ICD-10-CM | POA: Diagnosis not present

## 2023-02-25 DIAGNOSIS — E785 Hyperlipidemia, unspecified: Secondary | ICD-10-CM | POA: Diagnosis not present

## 2023-02-25 DIAGNOSIS — E213 Hyperparathyroidism, unspecified: Secondary | ICD-10-CM | POA: Diagnosis not present

## 2023-02-25 DIAGNOSIS — F0390 Unspecified dementia without behavioral disturbance: Secondary | ICD-10-CM | POA: Diagnosis not present

## 2023-02-25 DIAGNOSIS — R634 Abnormal weight loss: Secondary | ICD-10-CM | POA: Diagnosis not present

## 2023-02-25 DIAGNOSIS — N3281 Overactive bladder: Secondary | ICD-10-CM | POA: Diagnosis not present

## 2023-02-26 DIAGNOSIS — F0390 Unspecified dementia without behavioral disturbance: Secondary | ICD-10-CM | POA: Diagnosis not present

## 2023-02-26 DIAGNOSIS — E782 Mixed hyperlipidemia: Secondary | ICD-10-CM | POA: Diagnosis not present

## 2023-02-26 DIAGNOSIS — N3281 Overactive bladder: Secondary | ICD-10-CM | POA: Diagnosis not present

## 2023-02-26 DIAGNOSIS — R634 Abnormal weight loss: Secondary | ICD-10-CM | POA: Diagnosis not present

## 2023-02-26 DIAGNOSIS — E785 Hyperlipidemia, unspecified: Secondary | ICD-10-CM | POA: Diagnosis not present

## 2023-02-26 DIAGNOSIS — I872 Venous insufficiency (chronic) (peripheral): Secondary | ICD-10-CM | POA: Diagnosis not present

## 2023-02-26 DIAGNOSIS — S065X0D Traumatic subdural hemorrhage without loss of consciousness, subsequent encounter: Secondary | ICD-10-CM | POA: Diagnosis not present

## 2023-02-26 DIAGNOSIS — M810 Age-related osteoporosis without current pathological fracture: Secondary | ICD-10-CM | POA: Diagnosis not present

## 2023-02-26 DIAGNOSIS — I10 Essential (primary) hypertension: Secondary | ICD-10-CM | POA: Diagnosis not present

## 2023-02-26 DIAGNOSIS — E213 Hyperparathyroidism, unspecified: Secondary | ICD-10-CM | POA: Diagnosis not present

## 2023-03-01 DIAGNOSIS — J309 Allergic rhinitis, unspecified: Secondary | ICD-10-CM | POA: Diagnosis not present

## 2023-03-01 DIAGNOSIS — E559 Vitamin D deficiency, unspecified: Secondary | ICD-10-CM | POA: Diagnosis not present

## 2023-03-01 DIAGNOSIS — I119 Hypertensive heart disease without heart failure: Secondary | ICD-10-CM | POA: Diagnosis not present

## 2023-03-03 DIAGNOSIS — N3281 Overactive bladder: Secondary | ICD-10-CM | POA: Diagnosis not present

## 2023-03-03 DIAGNOSIS — E213 Hyperparathyroidism, unspecified: Secondary | ICD-10-CM | POA: Diagnosis not present

## 2023-03-03 DIAGNOSIS — F0390 Unspecified dementia without behavioral disturbance: Secondary | ICD-10-CM | POA: Diagnosis not present

## 2023-03-03 DIAGNOSIS — E785 Hyperlipidemia, unspecified: Secondary | ICD-10-CM | POA: Diagnosis not present

## 2023-03-03 DIAGNOSIS — S065X0D Traumatic subdural hemorrhage without loss of consciousness, subsequent encounter: Secondary | ICD-10-CM | POA: Diagnosis not present

## 2023-03-03 DIAGNOSIS — R634 Abnormal weight loss: Secondary | ICD-10-CM | POA: Diagnosis not present

## 2023-03-04 DIAGNOSIS — E213 Hyperparathyroidism, unspecified: Secondary | ICD-10-CM | POA: Diagnosis not present

## 2023-03-04 DIAGNOSIS — E785 Hyperlipidemia, unspecified: Secondary | ICD-10-CM | POA: Diagnosis not present

## 2023-03-04 DIAGNOSIS — N3281 Overactive bladder: Secondary | ICD-10-CM | POA: Diagnosis not present

## 2023-03-04 DIAGNOSIS — F0390 Unspecified dementia without behavioral disturbance: Secondary | ICD-10-CM | POA: Diagnosis not present

## 2023-03-04 DIAGNOSIS — S065X0D Traumatic subdural hemorrhage without loss of consciousness, subsequent encounter: Secondary | ICD-10-CM | POA: Diagnosis not present

## 2023-03-04 DIAGNOSIS — R634 Abnormal weight loss: Secondary | ICD-10-CM | POA: Diagnosis not present

## 2023-03-05 DIAGNOSIS — E213 Hyperparathyroidism, unspecified: Secondary | ICD-10-CM | POA: Diagnosis not present

## 2023-03-05 DIAGNOSIS — S065X0D Traumatic subdural hemorrhage without loss of consciousness, subsequent encounter: Secondary | ICD-10-CM | POA: Diagnosis not present

## 2023-03-05 DIAGNOSIS — R634 Abnormal weight loss: Secondary | ICD-10-CM | POA: Diagnosis not present

## 2023-03-05 DIAGNOSIS — N3281 Overactive bladder: Secondary | ICD-10-CM | POA: Diagnosis not present

## 2023-03-05 DIAGNOSIS — E785 Hyperlipidemia, unspecified: Secondary | ICD-10-CM | POA: Diagnosis not present

## 2023-03-05 DIAGNOSIS — F0390 Unspecified dementia without behavioral disturbance: Secondary | ICD-10-CM | POA: Diagnosis not present

## 2023-03-08 DIAGNOSIS — I119 Hypertensive heart disease without heart failure: Secondary | ICD-10-CM | POA: Diagnosis not present

## 2023-03-08 DIAGNOSIS — E213 Hyperparathyroidism, unspecified: Secondary | ICD-10-CM | POA: Diagnosis not present

## 2023-03-09 DIAGNOSIS — F331 Major depressive disorder, recurrent, moderate: Secondary | ICD-10-CM | POA: Diagnosis not present

## 2023-03-09 DIAGNOSIS — F0280 Dementia in other diseases classified elsewhere without behavioral disturbance: Secondary | ICD-10-CM | POA: Diagnosis not present

## 2023-03-09 DIAGNOSIS — G309 Alzheimer's disease, unspecified: Secondary | ICD-10-CM | POA: Diagnosis not present

## 2023-03-09 DIAGNOSIS — F0154 Vascular dementia, unspecified severity, with anxiety: Secondary | ICD-10-CM | POA: Diagnosis not present

## 2023-03-10 DIAGNOSIS — N3281 Overactive bladder: Secondary | ICD-10-CM | POA: Diagnosis not present

## 2023-03-10 DIAGNOSIS — F0390 Unspecified dementia without behavioral disturbance: Secondary | ICD-10-CM | POA: Diagnosis not present

## 2023-03-10 DIAGNOSIS — E213 Hyperparathyroidism, unspecified: Secondary | ICD-10-CM | POA: Diagnosis not present

## 2023-03-10 DIAGNOSIS — S065X0D Traumatic subdural hemorrhage without loss of consciousness, subsequent encounter: Secondary | ICD-10-CM | POA: Diagnosis not present

## 2023-03-10 DIAGNOSIS — E785 Hyperlipidemia, unspecified: Secondary | ICD-10-CM | POA: Diagnosis not present

## 2023-03-10 DIAGNOSIS — R634 Abnormal weight loss: Secondary | ICD-10-CM | POA: Diagnosis not present

## 2023-03-12 DIAGNOSIS — N3281 Overactive bladder: Secondary | ICD-10-CM | POA: Diagnosis not present

## 2023-03-12 DIAGNOSIS — R634 Abnormal weight loss: Secondary | ICD-10-CM | POA: Diagnosis not present

## 2023-03-12 DIAGNOSIS — E785 Hyperlipidemia, unspecified: Secondary | ICD-10-CM | POA: Diagnosis not present

## 2023-03-12 DIAGNOSIS — E213 Hyperparathyroidism, unspecified: Secondary | ICD-10-CM | POA: Diagnosis not present

## 2023-03-12 DIAGNOSIS — S065X0D Traumatic subdural hemorrhage without loss of consciousness, subsequent encounter: Secondary | ICD-10-CM | POA: Diagnosis not present

## 2023-03-12 DIAGNOSIS — F0390 Unspecified dementia without behavioral disturbance: Secondary | ICD-10-CM | POA: Diagnosis not present

## 2023-03-17 DIAGNOSIS — S065X0D Traumatic subdural hemorrhage without loss of consciousness, subsequent encounter: Secondary | ICD-10-CM | POA: Diagnosis not present

## 2023-03-17 DIAGNOSIS — N3281 Overactive bladder: Secondary | ICD-10-CM | POA: Diagnosis not present

## 2023-03-17 DIAGNOSIS — E213 Hyperparathyroidism, unspecified: Secondary | ICD-10-CM | POA: Diagnosis not present

## 2023-03-17 DIAGNOSIS — F0390 Unspecified dementia without behavioral disturbance: Secondary | ICD-10-CM | POA: Diagnosis not present

## 2023-03-17 DIAGNOSIS — E785 Hyperlipidemia, unspecified: Secondary | ICD-10-CM | POA: Diagnosis not present

## 2023-03-17 DIAGNOSIS — R634 Abnormal weight loss: Secondary | ICD-10-CM | POA: Diagnosis not present

## 2023-03-18 DIAGNOSIS — E213 Hyperparathyroidism, unspecified: Secondary | ICD-10-CM | POA: Diagnosis not present

## 2023-03-18 DIAGNOSIS — N3281 Overactive bladder: Secondary | ICD-10-CM | POA: Diagnosis not present

## 2023-03-18 DIAGNOSIS — S065X0D Traumatic subdural hemorrhage without loss of consciousness, subsequent encounter: Secondary | ICD-10-CM | POA: Diagnosis not present

## 2023-03-18 DIAGNOSIS — E785 Hyperlipidemia, unspecified: Secondary | ICD-10-CM | POA: Diagnosis not present

## 2023-03-18 DIAGNOSIS — R634 Abnormal weight loss: Secondary | ICD-10-CM | POA: Diagnosis not present

## 2023-03-18 DIAGNOSIS — F0390 Unspecified dementia without behavioral disturbance: Secondary | ICD-10-CM | POA: Diagnosis not present

## 2023-03-19 DIAGNOSIS — E213 Hyperparathyroidism, unspecified: Secondary | ICD-10-CM | POA: Diagnosis not present

## 2023-03-19 DIAGNOSIS — N3281 Overactive bladder: Secondary | ICD-10-CM | POA: Diagnosis not present

## 2023-03-19 DIAGNOSIS — E785 Hyperlipidemia, unspecified: Secondary | ICD-10-CM | POA: Diagnosis not present

## 2023-03-19 DIAGNOSIS — F0390 Unspecified dementia without behavioral disturbance: Secondary | ICD-10-CM | POA: Diagnosis not present

## 2023-03-19 DIAGNOSIS — R634 Abnormal weight loss: Secondary | ICD-10-CM | POA: Diagnosis not present

## 2023-03-19 DIAGNOSIS — S065X0D Traumatic subdural hemorrhage without loss of consciousness, subsequent encounter: Secondary | ICD-10-CM | POA: Diagnosis not present

## 2023-03-20 DIAGNOSIS — R634 Abnormal weight loss: Secondary | ICD-10-CM | POA: Diagnosis not present

## 2023-03-20 DIAGNOSIS — N3281 Overactive bladder: Secondary | ICD-10-CM | POA: Diagnosis not present

## 2023-03-20 DIAGNOSIS — S065X0D Traumatic subdural hemorrhage without loss of consciousness, subsequent encounter: Secondary | ICD-10-CM | POA: Diagnosis not present

## 2023-03-20 DIAGNOSIS — F0390 Unspecified dementia without behavioral disturbance: Secondary | ICD-10-CM | POA: Diagnosis not present

## 2023-03-20 DIAGNOSIS — E785 Hyperlipidemia, unspecified: Secondary | ICD-10-CM | POA: Diagnosis not present

## 2023-03-20 DIAGNOSIS — E213 Hyperparathyroidism, unspecified: Secondary | ICD-10-CM | POA: Diagnosis not present

## 2023-03-21 DIAGNOSIS — E785 Hyperlipidemia, unspecified: Secondary | ICD-10-CM | POA: Diagnosis not present

## 2023-03-21 DIAGNOSIS — M810 Age-related osteoporosis without current pathological fracture: Secondary | ICD-10-CM | POA: Diagnosis not present

## 2023-03-21 DIAGNOSIS — Z87440 Personal history of urinary (tract) infections: Secondary | ICD-10-CM | POA: Diagnosis not present

## 2023-03-21 DIAGNOSIS — N3281 Overactive bladder: Secondary | ICD-10-CM | POA: Diagnosis not present

## 2023-03-21 DIAGNOSIS — J302 Other seasonal allergic rhinitis: Secondary | ICD-10-CM | POA: Diagnosis not present

## 2023-03-21 DIAGNOSIS — S065X0D Traumatic subdural hemorrhage without loss of consciousness, subsequent encounter: Secondary | ICD-10-CM | POA: Diagnosis not present

## 2023-03-21 DIAGNOSIS — E213 Hyperparathyroidism, unspecified: Secondary | ICD-10-CM | POA: Diagnosis not present

## 2023-03-21 DIAGNOSIS — F0390 Unspecified dementia without behavioral disturbance: Secondary | ICD-10-CM | POA: Diagnosis not present

## 2023-03-21 DIAGNOSIS — R634 Abnormal weight loss: Secondary | ICD-10-CM | POA: Diagnosis not present

## 2023-04-20 DEATH — deceased

## 2023-04-28 ENCOUNTER — Ambulatory Visit: Payer: Medicare Other | Admitting: "Endocrinology

## 2023-05-17 ENCOUNTER — Other Ambulatory Visit (HOSPITAL_COMMUNITY): Payer: Medicare Other

## 2023-05-18 ENCOUNTER — Ambulatory Visit: Payer: Medicare Other | Admitting: Physician Assistant

## 2024-06-11 IMAGING — DX DG FEMUR 2+V PORT*R*
4 series · 4 of 4 positions shown · non-contrast
Comparison: None Available.

CLINICAL DATA: Unwitnessed fall, right hip pain extending to right
knee

EXAM:
RIGHT FEMUR PORTABLE 2 VIEW

[femur ap proximal (1 of 2)]
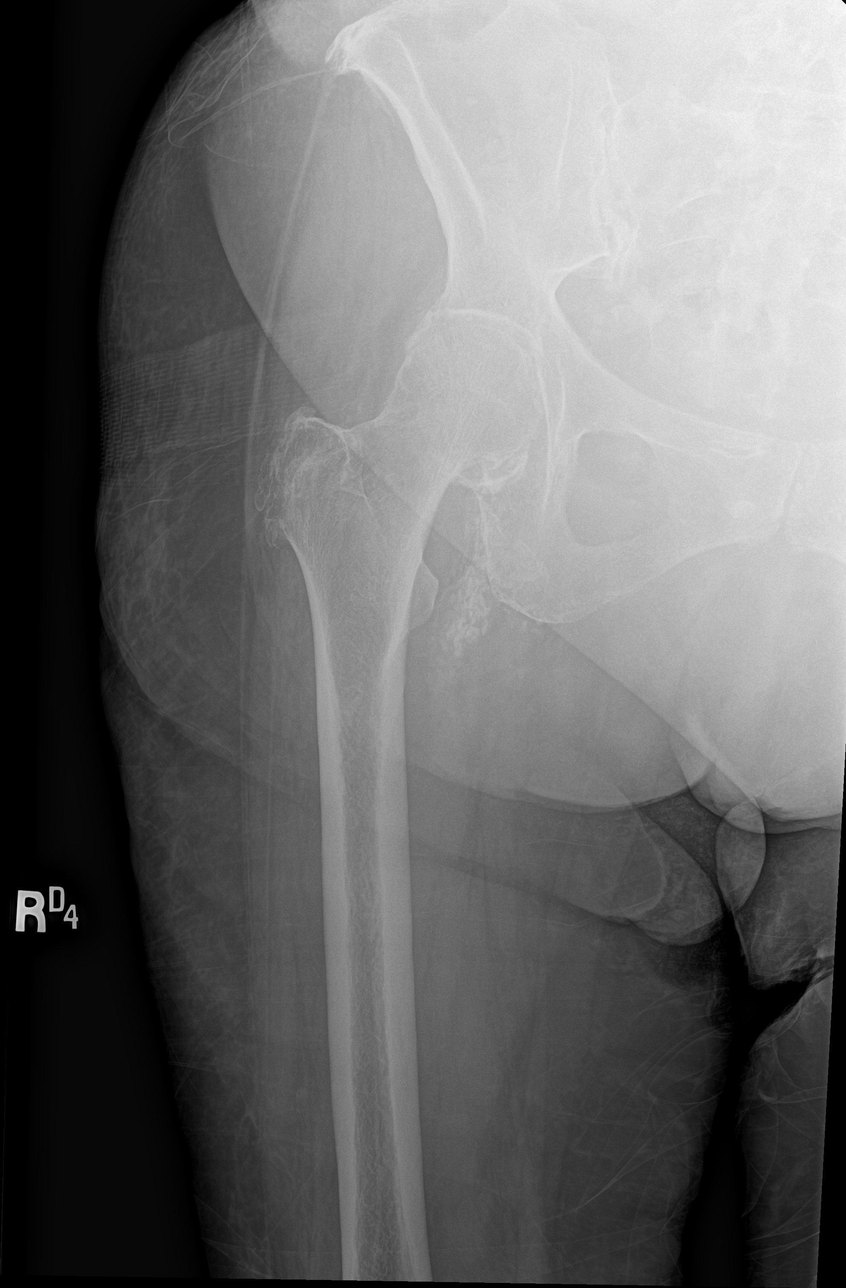

[femur ap proximal (2 of 2)]
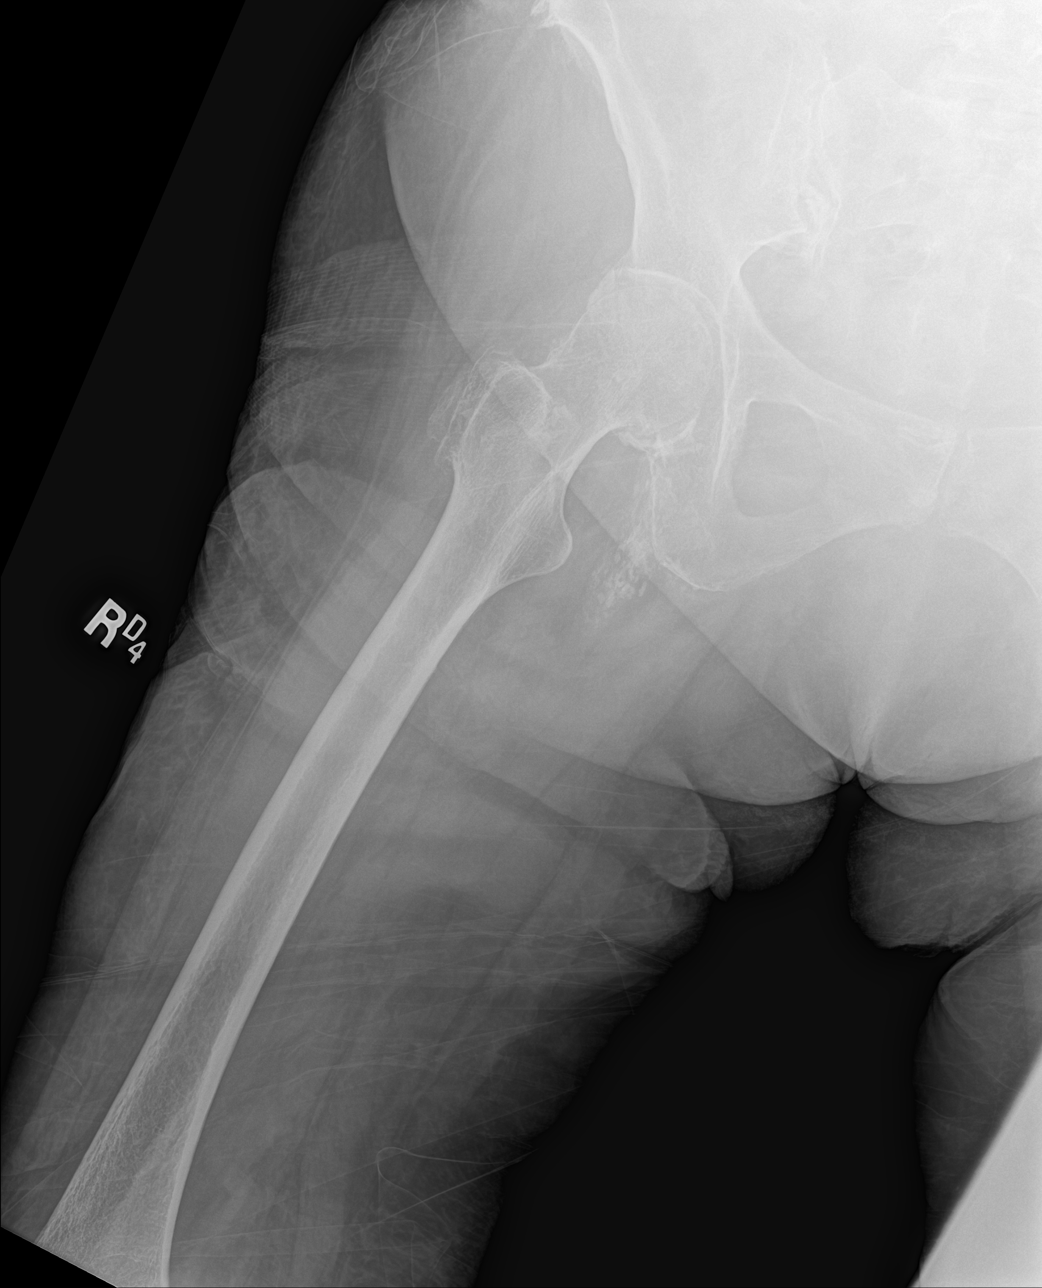

[femur lat distal (1 of 2)]
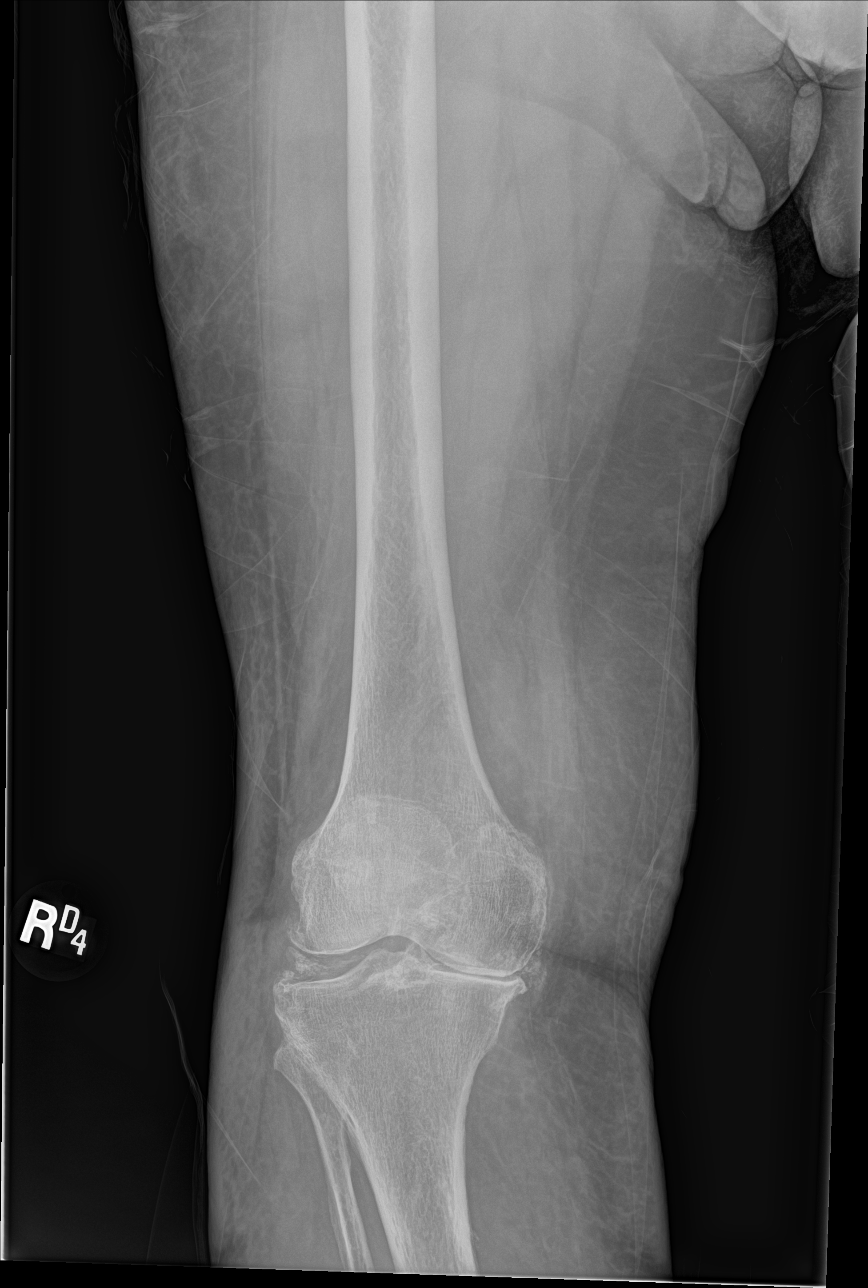

[femur lat distal (2 of 2)]
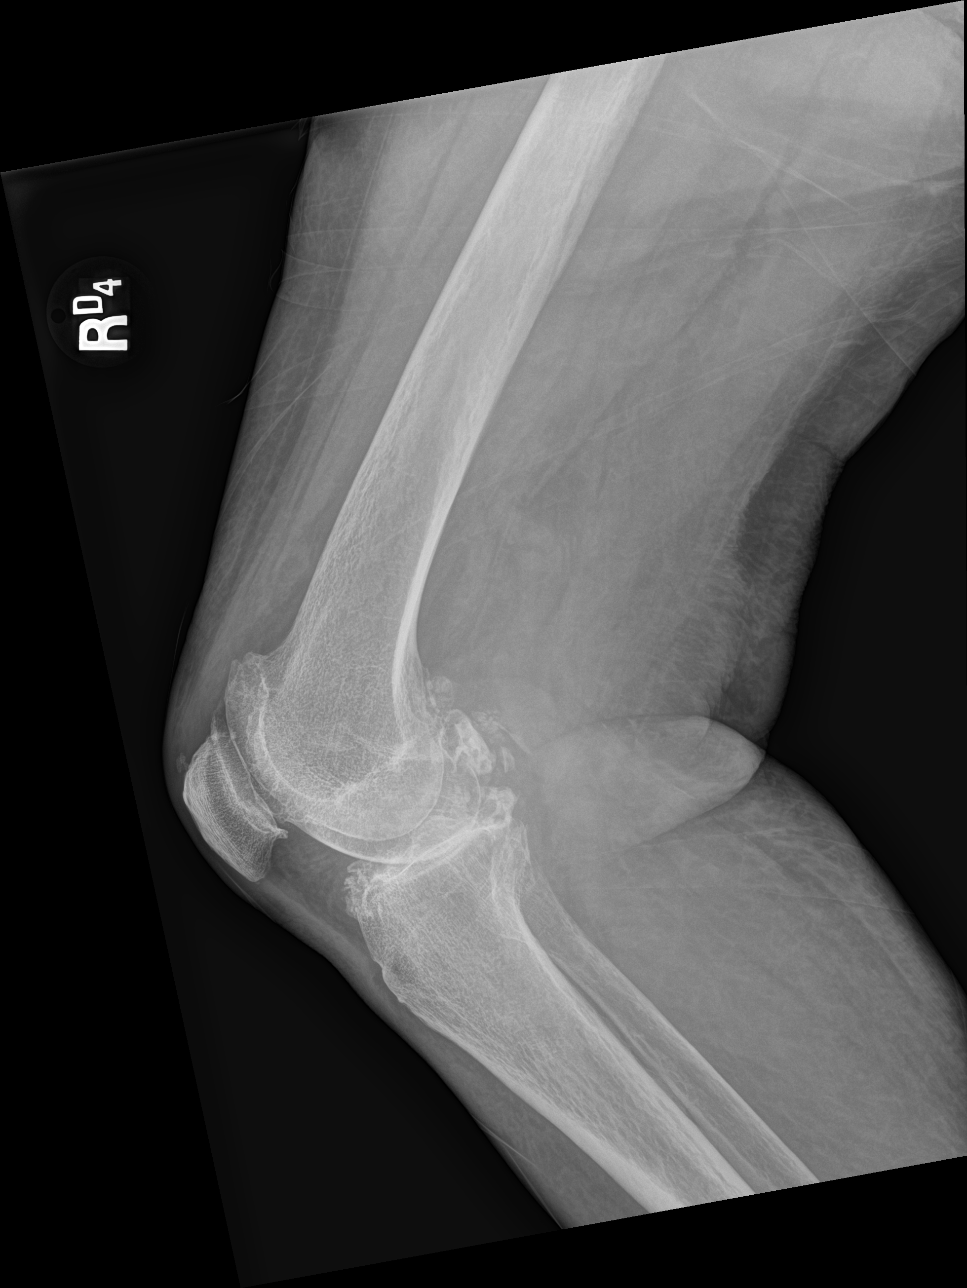

[4 of 4 positions shown; findings below may reference images not displayed]

FINDINGS: There is no evidence of acute fracture. Alignment is normal. There
is moderate right hip osteoarthritis. No evidence of distal femur
fracture. There is tricompartment osteoarthritis of the knee with
severe medial compartment joint space narrowing and
chondrocalcinosis.
IMPRESSION: No evidence of acute fracture involving the right femur. Note that
if the patient is unable to bear weight and clinical suspicion for
non-displaced hip fracture is significant, CT or MRI would be more
sensitive.

Tricompartment osteoarthritis of the right knee with severe medial
compartment joint space narrowing and chondrocalcinosis, which can
be seen in CPPD arthropathy or osteoarthritis.

## 2024-06-11 IMAGING — DX DG PELVIS 1-2V
1 series · 1 of 1 positions shown · non-contrast
Comparison: CT 05/02/2021

CLINICAL DATA: Unwitnessed fall

EXAM:
PELVIS - 1-2 VIEW

[pelvis ap]
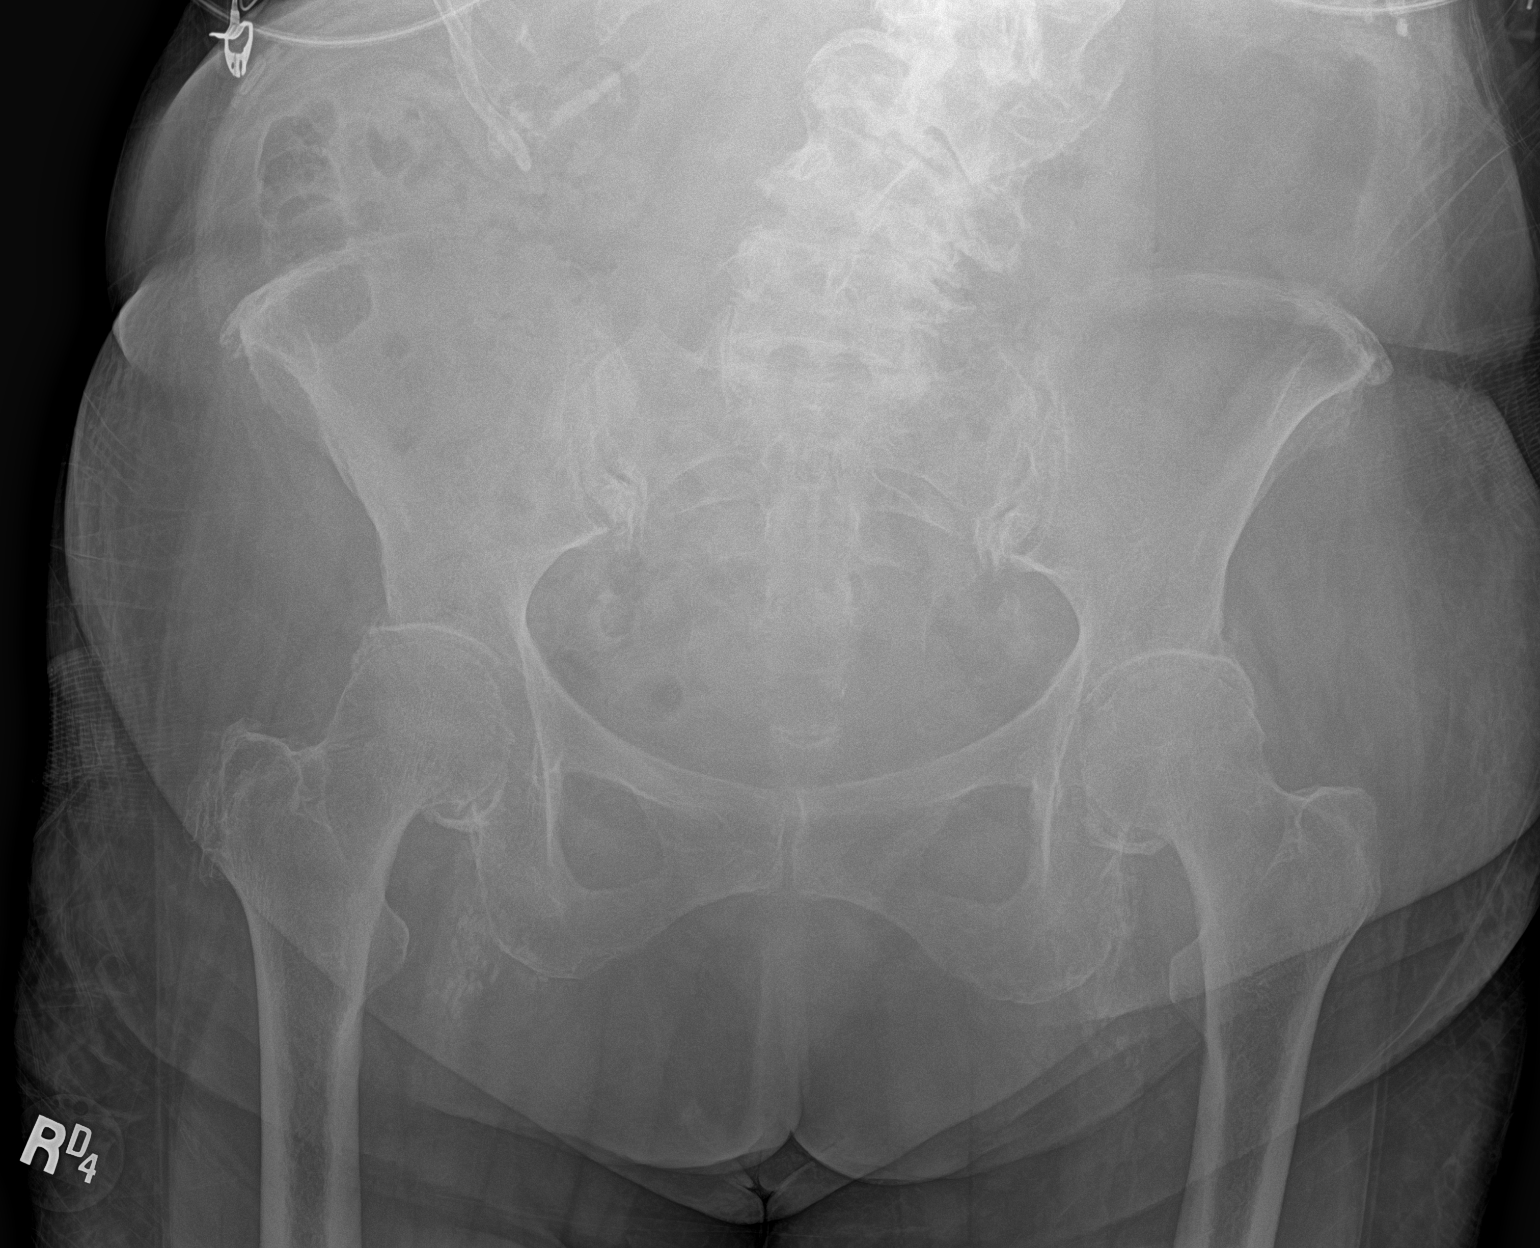

[1 of 1 positions shown; findings below may reference images not displayed]

FINDINGS: There is no evidence of acute fracture. There is moderate bilateral
hip osteoarthritis. Lumbar spine degenerative changes with
levoconvex curvature. Proximal hamstring calcifications. Right
greater trochanter enthesophyte formation.
IMPRESSION: No evidence of acute fracture on single frontal view of the pelvis.
Note that if the patient is unable to bear weight and clinical
suspicion for non-displaced hip fracture is significant, CT or MRI
would be more sensitive.

Moderate bilateral hip osteoarthritis.

## 2024-06-11 IMAGING — CT CT CERVICAL SPINE W/O CM
3 of 4 series · 13 of 33 positions shown, 16 images · non-contrast
Comparison: 12/05/2021

CLINICAL DATA: Trauma, fall



[Series 5: sagittal bone · sagittal · 0.34mm/px · 5 of 61 slices shown, 6 images]
[im 21/61  bone]
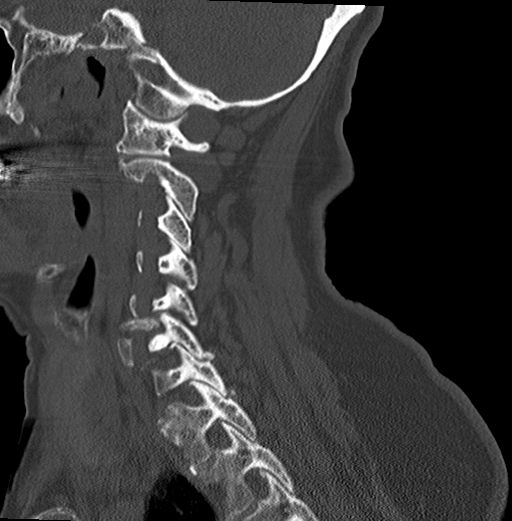
[im 26/61  bone]
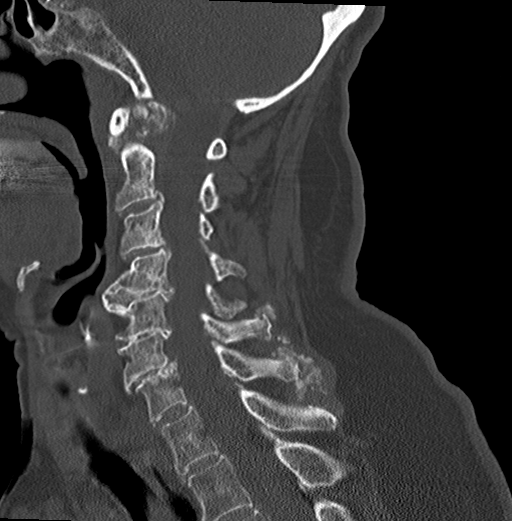
[im 31/61  soft-tissue]
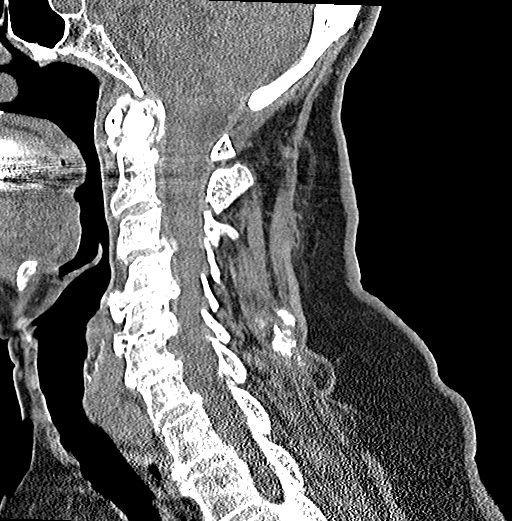
[im 31/61  bone]
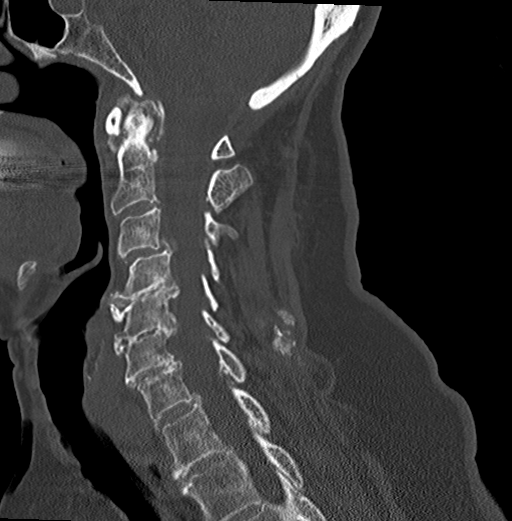
[im 36/61  bone]
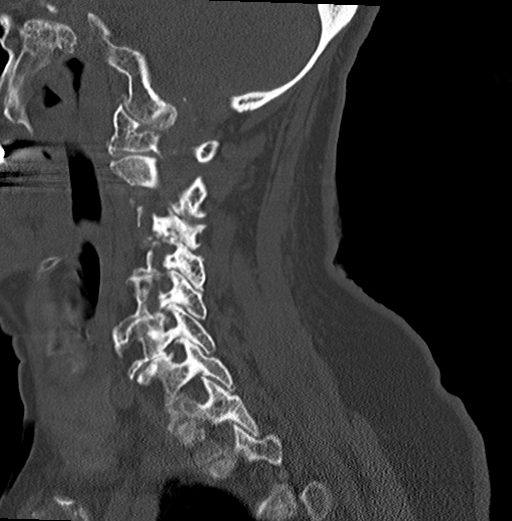
[im 41/61  bone]
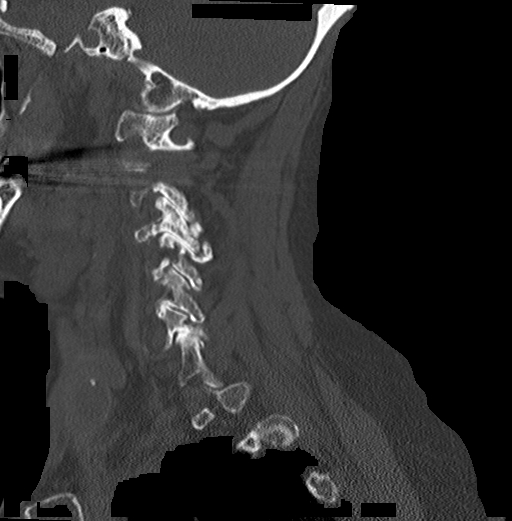

[Series 6: coronal bone · coronal · 0.22mm/px · 3 of 72 slices shown]
[im 15/72  bone]
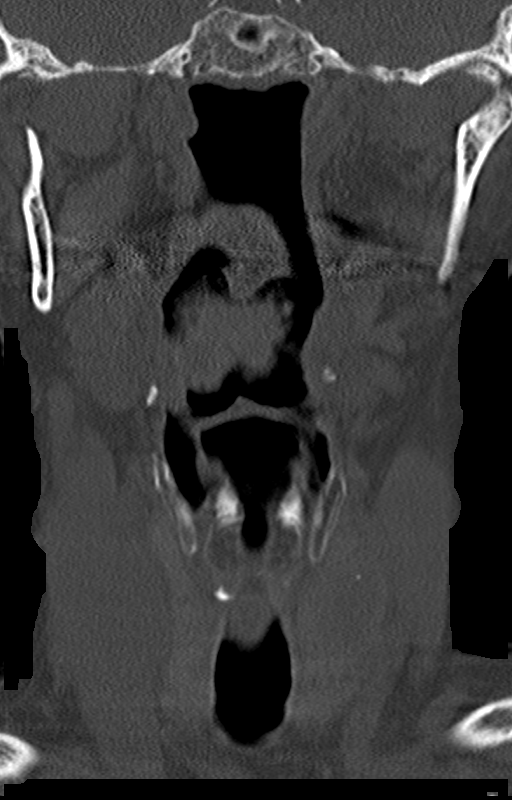
[im 29/72  bone]
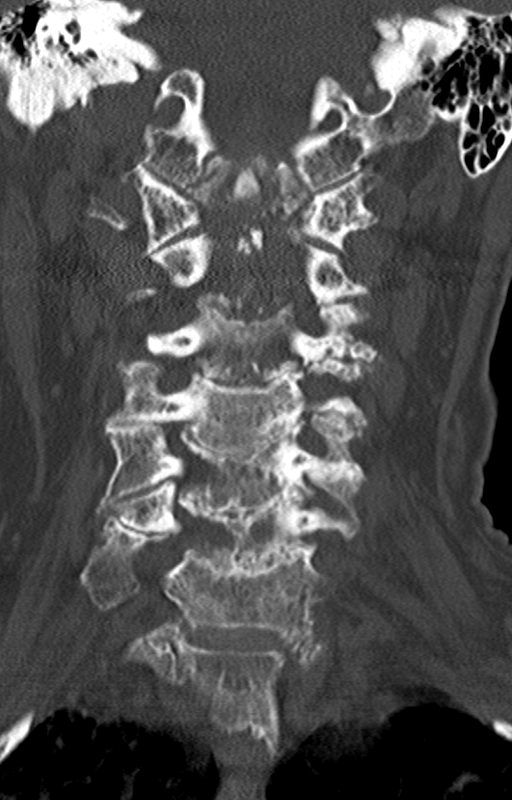
[im 43/72  bone]
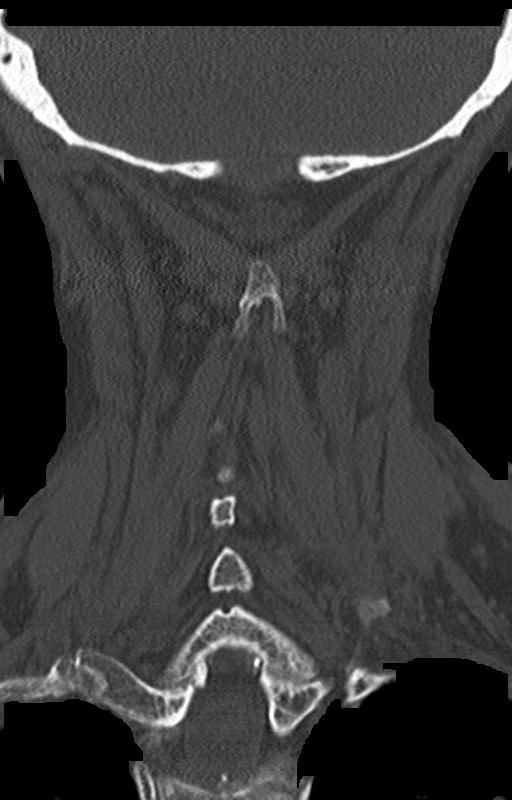

[Series 7: orthogonal axials · axial · 0.21mm/px · z∈[+111,+221]mm · 5 of 86 slices shown, 7 images]
[im 15/86  soft-tissue]
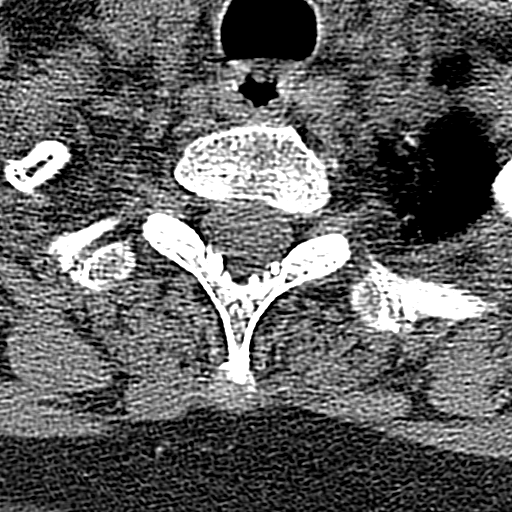
[im 15/86  bone]
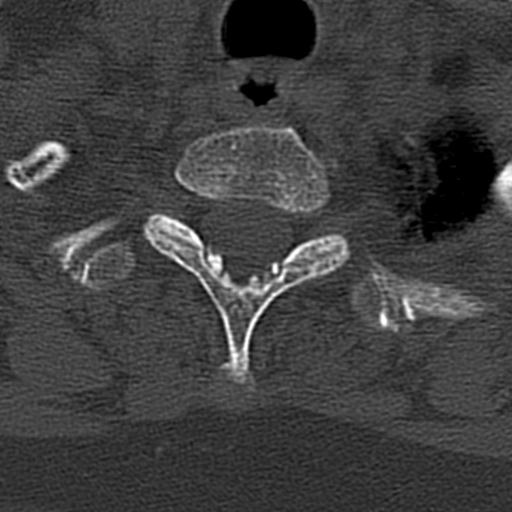
[im 29/86  bone]
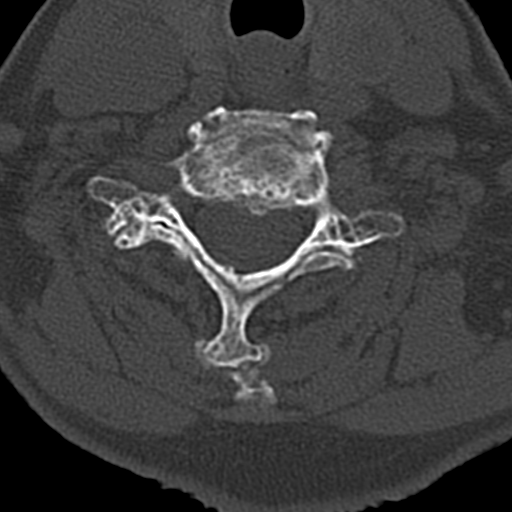
[im 43/86  bone]
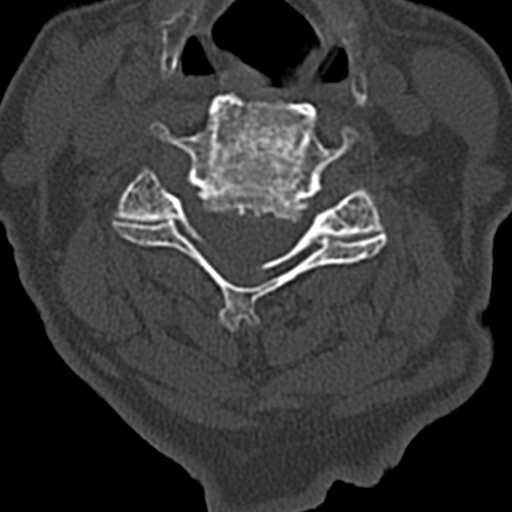
[im 57/86  bone]
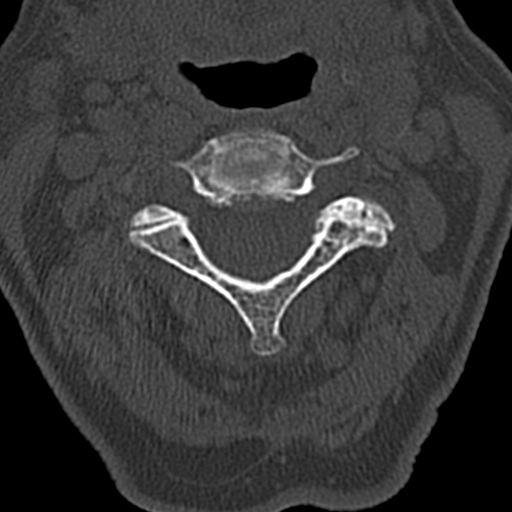
[im 71/86  soft-tissue]
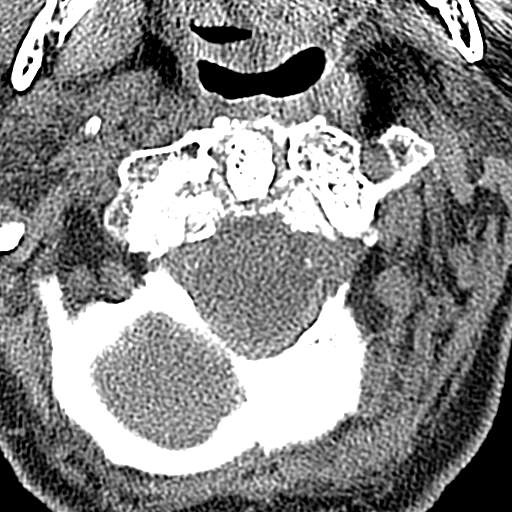
[im 71/86  bone]
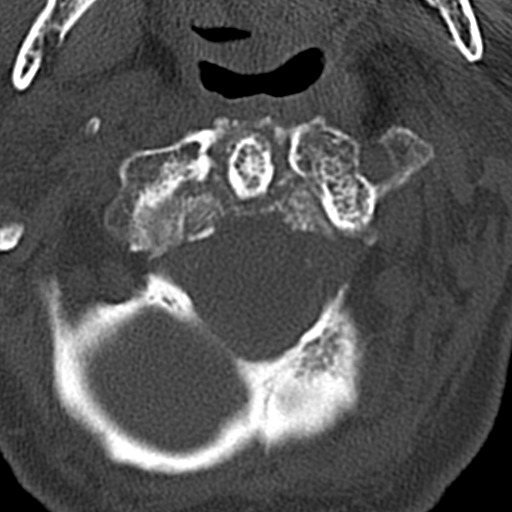

[13 of 33 positions shown; findings below may reference images not displayed]

FINDINGS: Alignment: There is minimal anterolisthesis at C3-C4 level with no
significant interval change.

Skull base and vertebrae: No recent fracture is seen. Degenerative
changes are noted throughout the cervical spine. There are smooth
marginated calcifications adjacent to the spinous processes of C5
and C6 vertebrae possibly ligament calcification from previous
injury with no significant interval change.

Soft tissues and spinal canal: There is extrinsic pressure over the
ventral margin of thecal sac at multiple levels caused by posterior
bony spurs at multiple levels, more so at C3-C4 level with spinal
stenosis.

Disc levels: There is moderate to marked encroachment of neural
foramina from C3-T1 levels. There is minimal encroachment of neural
foramina at C2-C3 level.

Upper chest: Unremarkable.

Other: There is inhomogeneous attenuation in the thyroid. There is
1.6 cm low-density nodule in the left lobe of thyroid with few
coarse calcifications.
IMPRESSION: No recent fracture is seen in the cervical spine. Cervical
spondylosis with spinal stenosis and encroachment of neural foramina
at multiple levels. No significant interval changes are noted since
12/05/2021.

There is inhomogeneous attenuation in the thyroid. There is 1.6 cm
low-density nodule with coarse calcifications in the left lobe.
Follow-up thyroid sonogram in outpatient setting should be
considered.

## 2024-06-11 IMAGING — CT CT CHEST-ABD-PELV W/O CM
2 of 4 series · 14 of 36 positions shown, 16 images · non-contrast
Comparison: Chest and pelvis radiographs earlier today.
Abdominopelvic CT 05/02/2021. Lumbar spine CT 12/05/2021

CLINICAL DATA: Polytrauma, blunt contrast allergy

Fall today with right-sided chest and abdominal pain.
EXAM:
CT CHEST, ABDOMEN AND PELVIS WITHOUT CONTRAST
TECHNIQUE: Multidetector CT imaging of the chest, abdomen and pelvis was
performed following the standard protocol without IV contrast.
RADIATION DOSE REDUCTION: This exam was performed according to the
departmental dose-optimization program which includes automated
exposure control, adjustment of the mA and/or kV according to
patient size and/or use of iterative reconstruction technique.

[Series 2: cap without · axial · non-contrast · 0.84mm/px · z∈[+660,+1160]mm · 11 of 120 slices shown, 13 images]
[im 10/120  mediastinal]
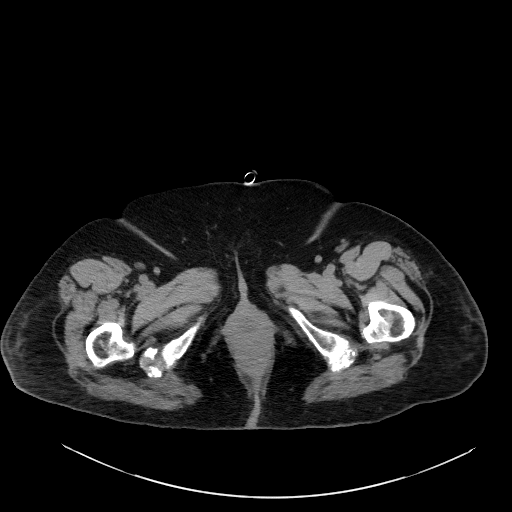
[im 10/120  bone]
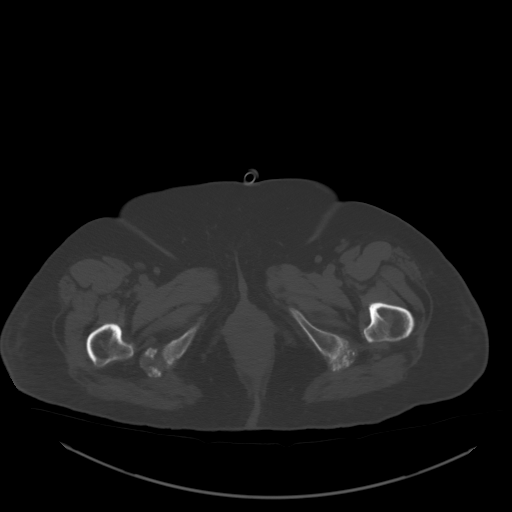
[im 19/120  mediastinal]
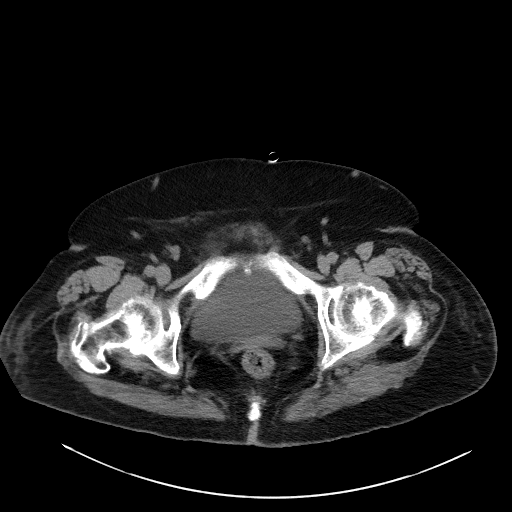
[im 28/120  mediastinal]
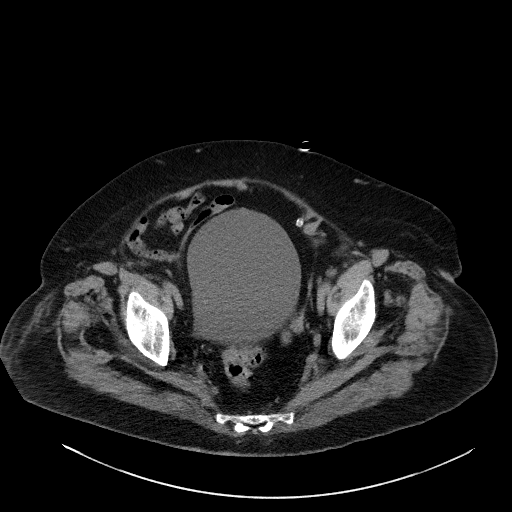
[im 37/120  mediastinal]
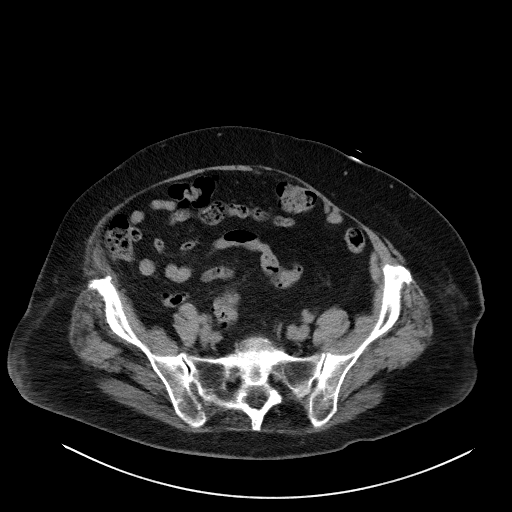
[im 46/120  mediastinal]
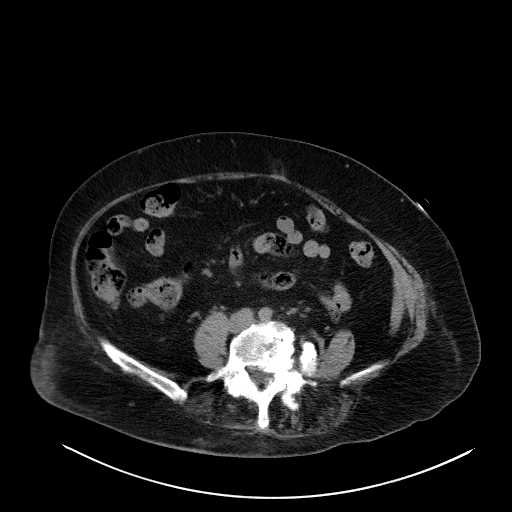
[im 65/120  mediastinal]
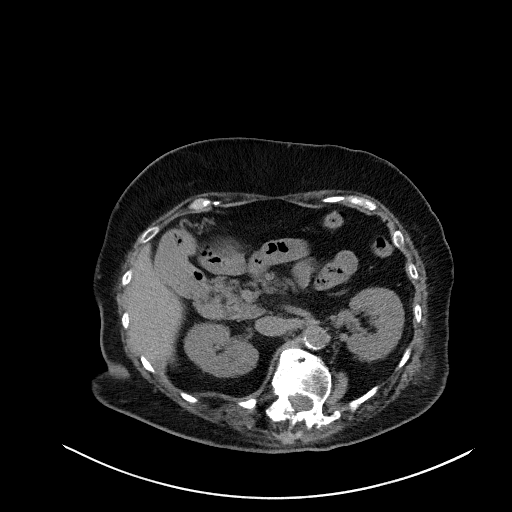
[im 74/120  mediastinal]
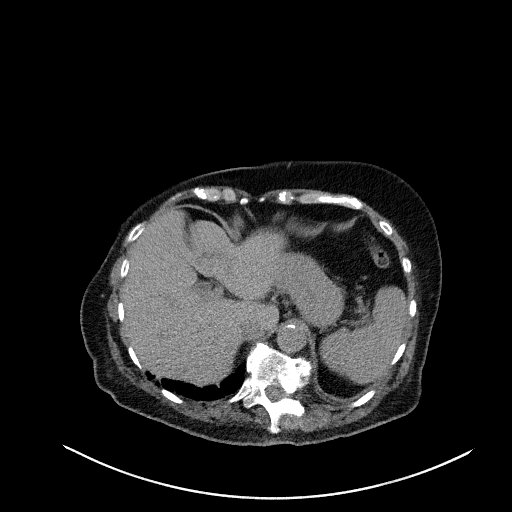
[im 83/120  mediastinal]
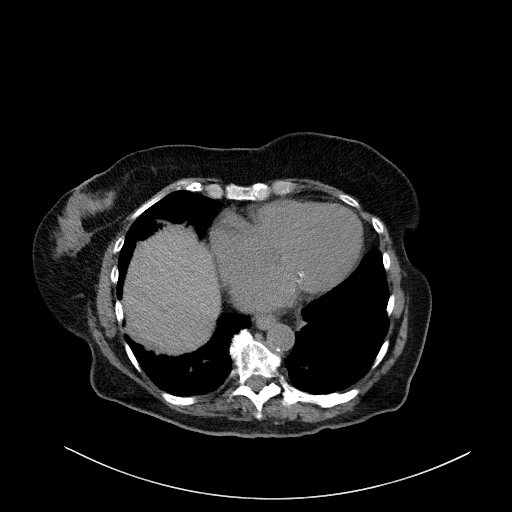
[im 92/120  mediastinal]
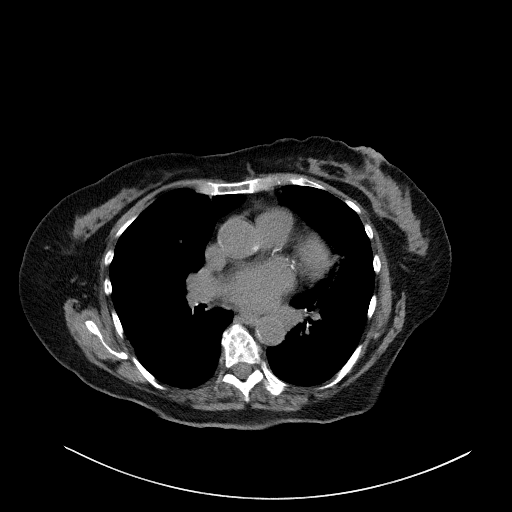
[im 92/120  bone]
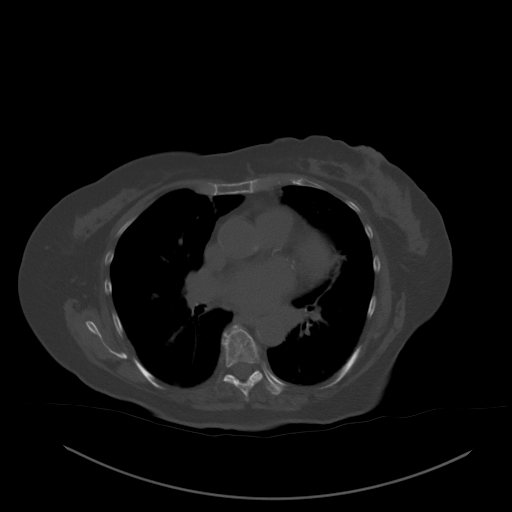
[im 101/120  mediastinal]
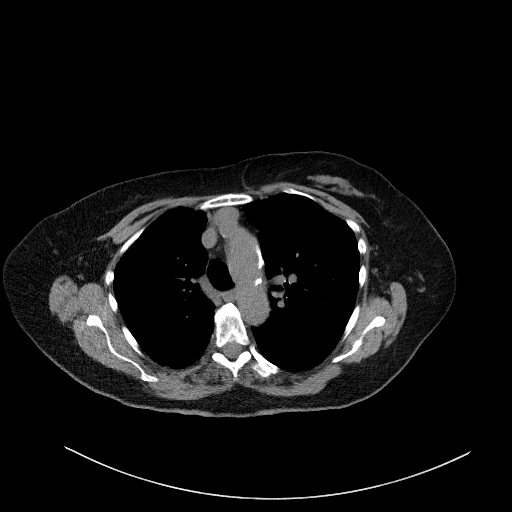
[im 110/120  mediastinal]
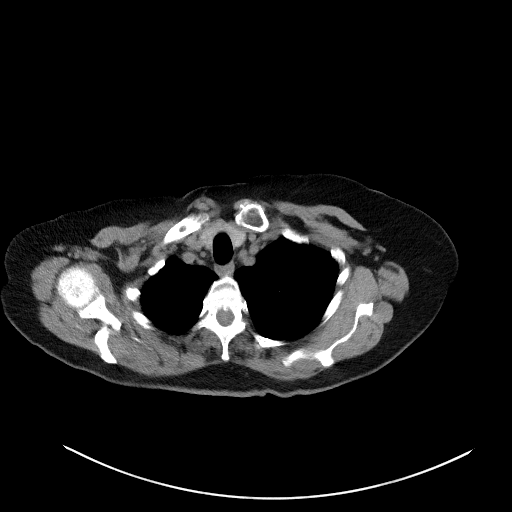

[Series 5: coronal · coronal · 0.90mm/px · 3 of 151 slices shown]
[im 31/151  mediastinal]
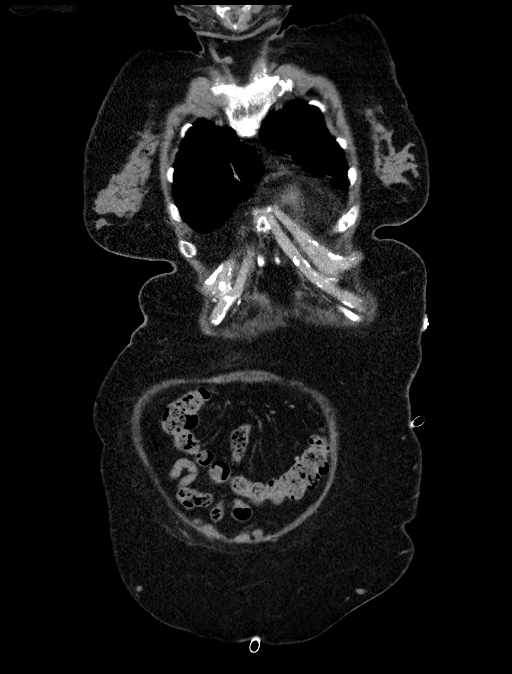
[im 61/151  mediastinal]
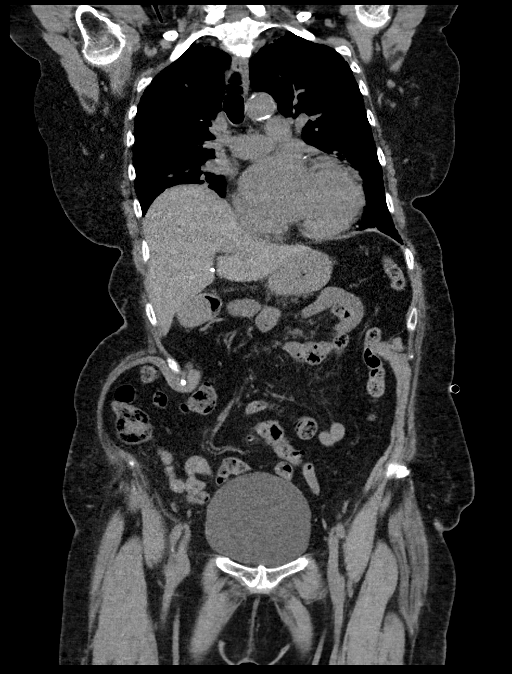
[im 91/151  mediastinal]
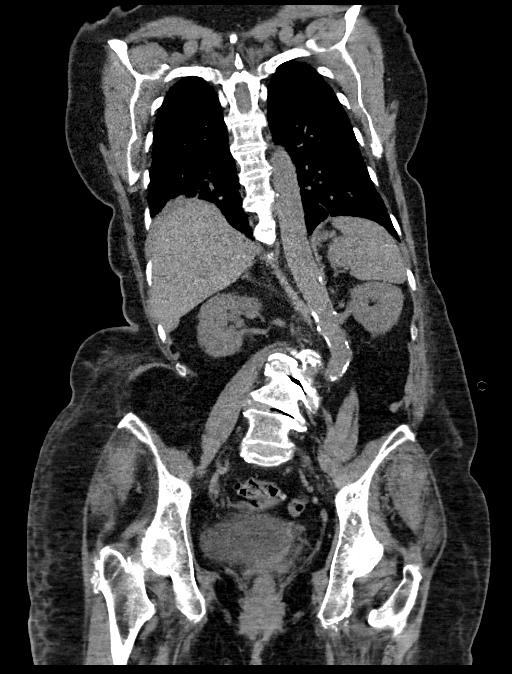

[14 of 36 positions shown; findings below may reference images not displayed]

FINDINGS: CT CHEST FINDINGS

Cardiovascular: Lack of IV contrast limits detailed assessment for
vascular injury. There is no periaortic stranding. Normal caliber
thoracic aorta with moderate atherosclerosis. The heart is mildly
enlarged. No pericardial effusion. There are coronary artery
calcifications.

Mediastinum/Nodes: No mediastinal hematoma. No mediastinal
adenopathy. Decompressed esophagus. 11 mm hypodense left thyroid
nodule. Not clinically significant; no follow-up imaging recommended
(ref: [HOSPITAL]. [DATE]): 143-50).

Lungs/Pleura: No pneumothorax. No pulmonary contusion. 8 x 6 mm
right lower lobe pulmonary nodule series 3, image 87, stable in size
dating back to 06/30/[address] abdominal CT. Additional 4 mm right lower
lobe nodule series 3, image 82 is also unchanged. No acute airspace
disease. Chronic elevation of right hemidiaphragm. There is slight
septal thickening at the lung apices. No pleural fluid.

Musculoskeletal: No acute rib fracture. Remote fracture of posterior
left tenth and eleventh ribs. The sternum and included shoulder
girdles are intact. Chondrocalcinosis in bilateral glenohumeral
osteoarthritis. Scoliosis and degenerative change in the thoracic
spine. No acute fracture. No chest wall soft tissue contusion.

CT ABDOMEN PELVIS FINDINGS

Hepatobiliary: Evaluation for injury is limited in the absence of IV
contrast. Stable cyst in the anterior left hepatic lobe. Homogeneous
hepatic attenuation. No perihepatic hematoma. Clips in the
gallbladder fossa postcholecystectomy. No biliary dilatation.

Pancreas: No evidence of injury on this unenhanced exam. Mild fatty
atrophy. No ductal dilatation or inflammation.

Spleen: Assessment for injury is limited in the absence of IV
contrast. Homogeneous attenuation. No perisplenic hematoma.

Adrenals/Urinary Tract: Normal adrenal glands. No perinephric
stranding to suggest injury. There are punctate bilateral
nonobstructing renal calculi. Stable cortical hypodensity in the
lateral left kidney, likely cyst, although too small to accurately
characterize. No further follow-up is recommended. Distended but
otherwise normal appearing urinary bladder.

Stomach/Bowel: Stomach is decompressed. There is no bowel
obstruction or inflammation. Left colonic diverticulosis without
diverticulitis. No evidence of bowel injury or mesenteric hematoma.

Vascular/Lymphatic: Assessment for vascular injury is limited on
this unenhanced exam. There is no periaortic or perivascular
haziness. Aortic atherosclerosis and tortuosity. No retroperitoneal
fluid. No adenopathy.

Reproductive: Status post hysterectomy. No adnexal masses.

Other: No free air or ascites. Patchy subcutaneous contusion
involving the right lateral abdominal wall. No large hematoma.

Musculoskeletal: Scoliosis with prominent degenerative change in the
lumbar spine. No acute lumbar fracture. Bilateral hip
osteoarthritis. No pelvic or hip fracture. Intact pubic rami.
IMPRESSION: 1. Patchy subcutaneous contusion involving the right lower lateral
abdominal wall. No large confluent hematoma.
2. No additional acute traumatic injury to the chest, abdomen, or
pelvis.
3. Incidental findings in the chest include right lower lobe
pulmonary nodules that are stable dating back to 06/30/2020
[address]. This exam constitutes 18 month imaging stability.
Nodules are likely benign.
4. Incidental colonic diverticulosis without diverticulitis.
Nonobstructing bilateral nephrolithiasis.

Aortic Atherosclerosis ([id number]).
